# Patient Record
Sex: Female | Born: 1970 | Race: Black or African American | Hispanic: No | Marital: Married | Smoking: Never smoker
Health system: Southern US, Community
[De-identification: ages and names within clinical notes are randomized; demographics above are authoritative.]

## PROBLEM LIST (undated history)

## (undated) DIAGNOSIS — M1990 Unspecified osteoarthritis, unspecified site: Secondary | ICD-10-CM

## (undated) DIAGNOSIS — I10 Essential (primary) hypertension: Secondary | ICD-10-CM

## (undated) DIAGNOSIS — E785 Hyperlipidemia, unspecified: Secondary | ICD-10-CM

## (undated) DIAGNOSIS — D649 Anemia, unspecified: Secondary | ICD-10-CM

## (undated) DIAGNOSIS — G4733 Obstructive sleep apnea (adult) (pediatric): Secondary | ICD-10-CM

## (undated) DIAGNOSIS — G4730 Sleep apnea, unspecified: Secondary | ICD-10-CM

## (undated) DIAGNOSIS — R600 Localized edema: Secondary | ICD-10-CM

## (undated) DIAGNOSIS — M109 Gout, unspecified: Secondary | ICD-10-CM

## (undated) HISTORY — DX: Unspecified osteoarthritis, unspecified site: M19.90

## (undated) HISTORY — DX: Sleep apnea, unspecified: G47.30

## (undated) HISTORY — PX: TUBAL LIGATION: SHX77

## (undated) HISTORY — DX: Hyperlipidemia, unspecified: E78.5

## (undated) HISTORY — DX: Obstructive sleep apnea (adult) (pediatric): G47.33

## (undated) HISTORY — DX: Localized edema: R60.0

## (undated) HISTORY — DX: Gout, unspecified: M10.9

---

## 2001-02-10 ENCOUNTER — Inpatient Hospital Stay (HOSPITAL_COMMUNITY): Admission: AD | Admit: 2001-02-10 | Discharge: 2001-02-10 | Payer: Self-pay | Admitting: Obstetrics & Gynecology

## 2004-09-04 ENCOUNTER — Encounter: Admission: RE | Admit: 2004-09-04 | Discharge: 2004-12-03 | Payer: Self-pay

## 2010-07-08 DIAGNOSIS — L659 Nonscarring hair loss, unspecified: Secondary | ICD-10-CM | POA: Insufficient documentation

## 2012-10-03 ENCOUNTER — Other Ambulatory Visit (HOSPITAL_COMMUNITY): Payer: Self-pay | Admitting: Obstetrics and Gynecology

## 2012-10-03 DIAGNOSIS — Z1231 Encounter for screening mammogram for malignant neoplasm of breast: Secondary | ICD-10-CM

## 2012-10-11 ENCOUNTER — Ambulatory Visit (HOSPITAL_COMMUNITY)
Admission: RE | Admit: 2012-10-11 | Discharge: 2012-10-11 | Disposition: A | Discharge status: Home / Self Care | Payer: 59 | Source: Ambulatory Visit | Attending: Obstetrics and Gynecology | Admitting: Obstetrics and Gynecology

## 2012-10-11 DIAGNOSIS — Z1231 Encounter for screening mammogram for malignant neoplasm of breast: Secondary | ICD-10-CM | POA: Insufficient documentation

## 2014-08-28 ENCOUNTER — Inpatient Hospital Stay (HOSPITAL_COMMUNITY): Payer: 59

## 2014-08-28 ENCOUNTER — Emergency Department (HOSPITAL_COMMUNITY): Payer: 59

## 2014-08-28 ENCOUNTER — Encounter (HOSPITAL_COMMUNITY): Payer: Self-pay | Admitting: Emergency Medicine

## 2014-08-28 ENCOUNTER — Observation Stay (HOSPITAL_COMMUNITY)
Admission: EM | Admit: 2014-08-28 | Discharge: 2014-08-29 | Disposition: A | Discharge status: Home / Self Care | Payer: 59 | Attending: Internal Medicine | Admitting: Internal Medicine

## 2014-08-28 DIAGNOSIS — Z880 Allergy status to penicillin: Secondary | ICD-10-CM | POA: Diagnosis not present

## 2014-08-28 DIAGNOSIS — R55 Syncope and collapse: Secondary | ICD-10-CM | POA: Diagnosis present

## 2014-08-28 DIAGNOSIS — R200 Anesthesia of skin: Secondary | ICD-10-CM | POA: Diagnosis present

## 2014-08-28 DIAGNOSIS — D649 Anemia, unspecified: Secondary | ICD-10-CM | POA: Diagnosis not present

## 2014-08-28 DIAGNOSIS — M1990 Unspecified osteoarthritis, unspecified site: Secondary | ICD-10-CM | POA: Insufficient documentation

## 2014-08-28 DIAGNOSIS — R0781 Pleurodynia: Principal | ICD-10-CM | POA: Insufficient documentation

## 2014-08-28 DIAGNOSIS — N920 Excessive and frequent menstruation with regular cycle: Secondary | ICD-10-CM | POA: Insufficient documentation

## 2014-08-28 DIAGNOSIS — R531 Weakness: Secondary | ICD-10-CM

## 2014-08-28 DIAGNOSIS — I639 Cerebral infarction, unspecified: Secondary | ICD-10-CM | POA: Diagnosis present

## 2014-08-28 DIAGNOSIS — I10 Essential (primary) hypertension: Secondary | ICD-10-CM | POA: Diagnosis not present

## 2014-08-28 DIAGNOSIS — R0789 Other chest pain: Secondary | ICD-10-CM

## 2014-08-28 DIAGNOSIS — G459 Transient cerebral ischemic attack, unspecified: Secondary | ICD-10-CM

## 2014-08-28 DIAGNOSIS — R079 Chest pain, unspecified: Secondary | ICD-10-CM | POA: Diagnosis present

## 2014-08-28 DIAGNOSIS — D259 Leiomyoma of uterus, unspecified: Secondary | ICD-10-CM | POA: Insufficient documentation

## 2014-08-28 HISTORY — DX: Anemia, unspecified: D64.9

## 2014-08-28 HISTORY — DX: Essential (primary) hypertension: I10

## 2014-08-28 LAB — DIFFERENTIAL
Basophils Absolute: 0 10*3/uL (ref 0.0–0.1)
Basophils Relative: 0 % (ref 0–1)
EOS PCT: 3 % (ref 0–5)
Eosinophils Absolute: 0.3 10*3/uL (ref 0.0–0.7)
LYMPHS ABS: 3.1 10*3/uL (ref 0.7–4.0)
Lymphocytes Relative: 31 % (ref 12–46)
MONOS PCT: 6 % (ref 3–12)
Monocytes Absolute: 0.6 10*3/uL (ref 0.1–1.0)
NEUTROS ABS: 5.9 10*3/uL (ref 1.7–7.7)
Neutrophils Relative %: 60 % (ref 43–77)

## 2014-08-28 LAB — COMPREHENSIVE METABOLIC PANEL
ALK PHOS: 87 U/L (ref 39–117)
ALT: 14 U/L (ref 0–35)
AST: 16 U/L (ref 0–37)
Albumin: 3.7 g/dL (ref 3.5–5.2)
Anion gap: 4 — ABNORMAL LOW (ref 5–15)
BILIRUBIN TOTAL: 0.4 mg/dL (ref 0.3–1.2)
BUN: 9 mg/dL (ref 6–23)
CHLORIDE: 107 mmol/L (ref 96–112)
CO2: 27 mmol/L (ref 19–32)
Calcium: 9.1 mg/dL (ref 8.4–10.5)
Creatinine, Ser: 0.78 mg/dL (ref 0.50–1.10)
GFR calc Af Amer: >90 mL/min (ref >90)
GFR calc non Af Amer: >90 mL/min (ref >90)
Glucose, Bld: 105 mg/dL — ABNORMAL HIGH (ref 70–99)
POTASSIUM: 4.2 mmol/L (ref 3.5–5.1)
SODIUM: 138 mmol/L (ref 135–145)
TOTAL PROTEIN: 7.1 g/dL (ref 6.0–8.3)

## 2014-08-28 LAB — CBC
HCT: 38.9 % (ref 36.0–46.0)
Hemoglobin: 12 g/dL (ref 12.0–15.0)
MCH: 21.6 pg — ABNORMAL LOW (ref 26.0–34.0)
MCHC: 30.8 g/dL (ref 30.0–36.0)
MCV: 70 fL — AB (ref 78.0–100.0)
Platelets: 380 10*3/uL (ref 150–400)
RBC: 5.56 MIL/uL — ABNORMAL HIGH (ref 3.87–5.11)
RDW: 18.1 % — ABNORMAL HIGH (ref 11.5–15.5)
WBC: 9.9 10*3/uL (ref 4.0–10.5)

## 2014-08-28 LAB — I-STAT CHEM 8, ED
BUN: 9 mg/dL (ref 6–23)
CHLORIDE: 104 mmol/L (ref 96–112)
Calcium, Ion: 1.12 mmol/L (ref 1.12–1.23)
Creatinine, Ser: 0.7 mg/dL (ref 0.50–1.10)
Glucose, Bld: 105 mg/dL — ABNORMAL HIGH (ref 70–99)
HEMATOCRIT: 43 % (ref 36.0–46.0)
HEMOGLOBIN: 14.6 g/dL (ref 12.0–15.0)
POTASSIUM: 4.1 mmol/L (ref 3.5–5.1)
SODIUM: 140 mmol/L (ref 135–145)
TCO2: 23 mmol/L (ref 0–100)

## 2014-08-28 LAB — TROPONIN I

## 2014-08-28 LAB — URINALYSIS, ROUTINE W REFLEX MICROSCOPIC
Bilirubin Urine: NEGATIVE
Glucose, UA: NEGATIVE mg/dL
Hgb urine dipstick: NEGATIVE
Ketones, ur: NEGATIVE mg/dL
Leukocytes, UA: NEGATIVE
Nitrite: NEGATIVE
Protein, ur: NEGATIVE mg/dL
SPECIFIC GRAVITY, URINE: 1.011 (ref 1.005–1.030)
UROBILINOGEN UA: 0.2 mg/dL (ref 0.0–1.0)
pH: 7 (ref 5.0–8.0)

## 2014-08-28 LAB — ETHANOL: Alcohol, Ethyl (B): <5 mg/dL (ref 0–9)

## 2014-08-28 LAB — LIPASE, BLOOD: Lipase: 23 U/L (ref 11–59)

## 2014-08-28 LAB — PROTIME-INR
INR: 1.02 (ref 0.00–1.49)
Prothrombin Time: 13.5 seconds (ref 11.6–15.2)

## 2014-08-28 LAB — RAPID URINE DRUG SCREEN, HOSP PERFORMED
Amphetamines: NOT DETECTED
Barbiturates: NOT DETECTED
Benzodiazepines: NOT DETECTED
Cocaine: NOT DETECTED
Opiates: NOT DETECTED
Tetrahydrocannabinol: NOT DETECTED

## 2014-08-28 LAB — I-STAT TROPONIN, ED: Troponin i, poc: 0 ng/mL (ref 0.00–0.08)

## 2014-08-28 LAB — I-STAT BETA HCG BLOOD, ED (MC, WL, AP ONLY)

## 2014-08-28 LAB — APTT: aPTT: 28 seconds (ref 24–37)

## 2014-08-28 MED ORDER — ATORVASTATIN CALCIUM 10 MG PO TABS
20.0000 mg | ORAL_TABLET | Freq: Every day | ORAL | Status: DC
  Administered 2014-08-28: 20 mg via ORAL
  Filled 2014-08-28: qty 2

## 2014-08-28 MED ORDER — ASPIRIN 300 MG RE SUPP: 300.0000 mg | Freq: Every day | RECTAL | Status: DC

## 2014-08-28 MED ORDER — ONDANSETRON HCL 4 MG/2ML IJ SOLN
4.0000 mg | Freq: Once | INTRAMUSCULAR | Status: AC
  Administered 2014-08-28: 4 mg via INTRAVENOUS
  Filled 2014-08-28: qty 2

## 2014-08-28 MED ORDER — SODIUM CHLORIDE 0.9 % IV SOLN: INTRAVENOUS | Status: DC

## 2014-08-28 MED ORDER — ASPIRIN 325 MG PO TABS
325.0000 mg | ORAL_TABLET | Freq: Every day | ORAL | Status: DC
  Administered 2014-08-29: 325 mg via ORAL
  Filled 2014-08-28: qty 1

## 2014-08-28 MED ORDER — IOHEXOL 350 MG/ML SOLN
100.0000 mL | Freq: Once | INTRAVENOUS | Status: AC | PRN
  Administered 2014-08-28: 100 mL via INTRAVENOUS

## 2014-08-28 MED ORDER — SENNOSIDES-DOCUSATE SODIUM 8.6-50 MG PO TABS: 1.0000 | ORAL_TABLET | Freq: Every evening | ORAL | Status: DC | PRN

## 2014-08-28 MED ORDER — ENOXAPARIN SODIUM 40 MG/0.4ML ~~LOC~~ SOLN
40.0000 mg | SUBCUTANEOUS | Status: DC
  Administered 2014-08-28: 40 mg via SUBCUTANEOUS
  Filled 2014-08-28: qty 0.4

## 2014-08-28 MED ORDER — TRAMADOL-ACETAMINOPHEN 37.5-325 MG PO TABS
2.0000 | ORAL_TABLET | Freq: Four times a day (QID) | ORAL | Status: DC | PRN
  Administered 2014-08-28 – 2014-08-29 (×4): 2 via ORAL
  Filled 2014-08-28 (×4): qty 2

## 2014-08-28 MED ORDER — SODIUM CHLORIDE 0.9 % IV SOLN
INTRAVENOUS | Status: DC
  Administered 2014-08-28: 18:00:00 via INTRAVENOUS

## 2014-08-28 MED ORDER — STROKE: EARLY STAGES OF RECOVERY BOOK
Freq: Once | Status: AC
  Administered 2014-08-28: 18:00:00

## 2014-08-28 NOTE — ED Notes (Signed)
Pt stating she is having sharp intermittent mid cp.

## 2014-08-28 NOTE — ED Provider Notes (Signed)
CSN: [id number]     Arrival date & time 08/28/14  1218 History   First MD Initiated Contact with Patient 08/28/14 1313     Chief Complaint  Patient presents with  . Code Stroke    @EDPCLEARED @ (Consider location/radiation/quality/duration/timing/severity/associated sxs/prior Treatment) HPI The patient reports to me she is having chest pain which is fairly sharp and central in nature. She describes having had right arm symptoms of some pain and numbness that has been coming and going. Also she is endorsing pain in her right leg that preceded onset of today's symptoms. Upon arrival the patient was evaluated for a code stroke as her chief complaint had been for right arm numbness and per EMS observation there was right arm drift present. The main event at work today occurred at 11:15. Her right arm was more numb and painful and she started having a coughing episode. The patient reports that she got very lightheaded and she nearly passed out at work. The patient has a past medical history significant for hypertension but has been off blood pressure medications for years duration. Past Medical History  Diagnosis Date  . Hypertension   . Anemia    Past Surgical History  Procedure Laterality Date  . Cesarean section     No family history on file. History  Substance Use Topics  . Smoking status: Never Smoker   . Smokeless tobacco: Not on file  . Alcohol Use: Yes     Comment: occasional glass of wine   OB History    No data available     Review of Systems 10 Systems reviewed and are negative for acute change except as noted in the HPI.    Allergies  Penicillins  Home Medications   Prior to Admission medications   Medication Sig Start Date End Date Taking? Authorizing Provider  naproxen sodium (ANAPROX) 220 MG tablet Take 440 mg by mouth 2 (two) times daily with a meal.   Yes Historical Provider, MD   BP 137/32 mmHg  Pulse 88  Temp(Src) 98.2 F (36.8 C) (Oral)  Resp 15  Ht  5\' 4"  (1.626 m)  Wt 397 lb 7.8 oz (180.3 kg)  BMI 68.20 kg/m2  SpO2 98%  LMP  Physical Exam  Constitutional: She is oriented to person, place, and time.  The patient is morbidly obese. She is alert and nontoxic. She does not have any acute respiratory distress. Her color is good  HENT:  Head: Normocephalic and atraumatic.  Right Ear: External ear normal.  Left Ear: External ear normal.  Nose: Nose normal.  Mouth/Throat: Oropharynx is clear and moist. No oropharyngeal exudate.  Eyes: EOM are normal. Pupils are equal, round, and reactive to light. Right eye exhibits no discharge. Left eye exhibits no discharge.  Neck: Neck supple.  Cardiovascular: Normal rate, regular rhythm, normal heart sounds and intact distal pulses.   No murmur heard. Pulmonary/Chest: Effort normal and breath sounds normal. No respiratory distress. She has no wheezes.  Abdominal: Soft. Bowel sounds are normal. She exhibits no distension. There is no tenderness. There is no guarding.  Musculoskeletal: Normal range of motion. She exhibits tenderness (The patient endorses tenderness to palpation of the soft tissues generally of the right upper extremity. There is no appreciable erythema or soft tissue abnormality. The distal pulses are 2+ and symmetric. ). She exhibits no edema.  The patient endorses tenderness palpation of the right lower leg. There is no apparent deformity or asymmetry. She is tender in the calf.  Neurological:  She is alert and oriented to person, place, and time. No cranial nerve deficit. She exhibits normal muscle tone. Coordination normal.  Skin: Skin is warm and dry. No rash noted.  Psychiatric: She has a normal mood and affect.    ED Course  Procedures (including critical care time) Labs Review Labs Reviewed  CBC - Abnormal; Notable for the following:    RBC 5.56 (*)    MCV 70.0 (*)    MCH 21.6 (*)    RDW 18.1 (*)    All other components within normal limits  COMPREHENSIVE METABOLIC PANEL  - Abnormal; Notable for the following:    Glucose, Bld 105 (*)    Anion gap 4 (*)    All other components within normal limits  I-STAT CHEM 8, ED - Abnormal; Notable for the following:    Glucose, Bld 105 (*)    All other components within normal limits  ETHANOL  PROTIME-INR  APTT  DIFFERENTIAL  URINE RAPID DRUG SCREEN (HOSP PERFORMED)  URINALYSIS, ROUTINE W REFLEX MICROSCOPIC  LIPASE, BLOOD  I-STAT TROPOININ, ED  I-STAT TROPOININ, ED  I-STAT BETA HCG BLOOD, ED ([facility], [facility], [facility] ONLY)    Imaging Review Ct Head Wo Contrast  08/28/2014   CLINICAL DATA:  Code stroke.  Right arm and leg pain.  EXAM: CT HEAD WITHOUT CONTRAST  TECHNIQUE: Contiguous axial images were obtained from the base of the skull through the vertex without intravenous contrast.  COMPARISON:  None.  FINDINGS: There is no evidence of acute intracranial hemorrhage, mass lesion, brain edema or extra-axial fluid collection. The ventricles and subarachnoid spaces are appropriately sized for age. There is no CT evidence of acute cortical infarction.  There is mucosal thickening or a mucous retention cyst laterally in the left frontal sinus. The visualized paranasal sinuses, mastoid air cells and middle ears are otherwise clear. The calvarium is intact.  IMPRESSION: 1. No acute intracranial findings.  No CT evidence of acute stroke. 2. Left frontal sinus mucosal thickening versus mucous retention cyst. 3. These results were called by telephone at the time of interpretation on 08/28/2014 at 12:38 pm to Dr. Nicole Kindred, who verbally acknowledged these results.   Electronically Signed   By: Richardean Sale M.D.   On: 08/28/2014 12:46     EKG Interpretation None      MDM   Final diagnoses:  Right sided weakness  Chest pain, unspecified chest pain type  Syncope and collapse   The patient presents on above this point time she will be admitted for further diagnostic evaluation and observation. Her mental status is clear and she has no  respiratory distress or airway difficulty. At the time of my examination there was not any significant localizing weakness. There was a significant pain component as well as the complaint of numbness.    Charlesetta Shanks, MD 08/28/14 1600

## 2014-08-28 NOTE — Progress Notes (Signed)
Patient arrived at 1N13. VSS, assessment completed and charted. Patient experiences periods of trouble breathing, lasting about 5 seconds. Patient told to take deep breaths, SaO2 100% RA, RR 16. MD paged no new orders.

## 2014-08-28 NOTE — ED Notes (Signed)
Admitting MD at bedside.

## 2014-08-28 NOTE — Code Documentation (Signed)
44yo female arriving to [facility] via [place] at 1210.  EMS reports that the patient was at work when she had a coughing spell and then syncopal episode and fell from her chair.  Coworkers report no LOC, however, patient does not recall episode.  Patient vomited after episode and then c/o right arm and leg pain and sharp chest pain.  EMS assessed right arm drift and numbness and tingling to the right face and arm and activated Code Stroke.  Stroke team at the bedside on arrival.  Labs drawn and patient to CT.  NIHSS 4, see documentation for details and code stroke times. Patient with right arm and leg pain with movement.  Patient reports right face and arm decreased sensation.  Patient is too mild to treat at this time but remains in the window to treat with tPA until 1545.  Bedside handoff with ED RN Hayley.

## 2014-08-28 NOTE — ED Notes (Signed)
Pt arrived by [facility] as Code Stroke. Pt was at work and started having right arm numbness and pain and right leg pain at 1115. Pt also started coughing and had a near syncopal episode per coworkers and then vomited. Pt stated that she feels like she passed out. Pt also having some CP as well. Right arm numbness and pain has been going off and on x 2 weeks but currently not going away. EMS noted right arm drift and right arm decrease in sensation. BP-167/108 HR-76 CBG-180. Pt has not been on BP medications x 1 year.

## 2014-08-28 NOTE — Consult Note (Signed)
Referring Physician: EDP    Chief Complaint: Code stroke  HPI:                                                                                                                                         Angela Tucker is an 44 y.o. female obesity, menorrhagia, right mild osteoarthritis, uterine fibroids, multigravida, HTN (not taking BP medications for 1 year per patient) presented to ED with code stroke.  Prior to ED visit she had intermittent entire right arm numbness noted 2 days ago which was transient.  Right arm numbness stopped and returned today while at work.  At work she initially had a coughing spell,  then a syncopal episode and fell from her chair but without LOC and though patient does not recall the episode.  She reported right upper extremity numbness and pain which started between 11 and 11:15 and lasting longer this occurrence.    Date last known well: Date: 08/28/2014 Time last known well: Time: 11:00 prior to 11 am.  tPA Given: No: minimal deficits risks outweigh benefits  Past Medical History  Diagnosis Date  . Hypertension   . Anemia     Past Surgical History  Procedure Laterality Date  . Cesarean section     Family history: Positive for stroke paternal and maternal both grandparents  Social History:  reports that she has never smoked. She does not have any smokeless tobacco history on file. She reports that she drinks alcohol. She reports that she does not use illicit drugs.  Allergies:  Allergies  Allergen Reactions  . Penicillins Hives    Medications:                                                                                                                           I have reviewed the patient's current medications.  ROS:  History obtained from the patient, husband  General ROS: negative for - fever Psychological ROS:  negative for - memory difficulties Ophthalmic ROS: negative for - blurry vision, double vision, eye pain or loss of vision ENT ROS: negative for - decreased hearing Hematological and Lymphatic ROS: negative for - bleeding problems, bruising or swollen lymph nodes Endocrine ROS: negative for - diabetes  Respiratory ROS: negative for - shortness of breath  Cardiovascular ROS: negative for - chest pain Gastrointestinal ROS: negative for - abdominal pain Musculoskeletal ROS: negative for - +joint swelling and pain right knee  Neurological ROS: as noted in HPI Dermatological ROS: negative for rash and skin lesion changes  Neurologic Examination:                                                                                                      Blood pressure 141/64, pulse 82, temperature 98.2 F (36.8 C), temperature source Oral, resp. rate 12, height 5\' 4"  (1.626 m), weight 397 lb 7.8 oz (180.3 kg), SpO2 99 %.  HEENT-  Normocephalic, no lesions, without obvious abnormality.  Normal external eye and conjunctiva.  Normal TM's bilaterally.  Normal external ears. Normal external nose, mucus membranes and septum.  Normal pharynx. Cardiovascular- regular rate and rhythm, no murmurs pulses palpable throughout   Lungs- chest clear, no wheezing, rales, normal symmetric air entry,  Abdomen- soft, non-tender; bowel sounds normal; no masses,  no organomegaly, obese Extremities- no edema, no cyanosis  Lymph-no gross adenopathy  Musculoskeletal-+right knee pain anterior and posterior, decreased ROM right knee, +right shoulder pain with ROM Skin-warm and dry, no hyperpigmentation, vitiligo, or suspicious lesions  Neurological Examination Mental Status: Alert, oriented, thought content appropriate.  Speech fluent without evidence of aphasia.  Able to follow 3 step commands without difficulty. Cranial Nerves: II: Discs flat bilaterally; Visual fields grossly normal, pupils equal, round, reactive to light  and accommodation III,IV, VI: ptosis not present, extra-ocular motions intact bilaterally V,VII: smile symmetric, facial light touch sensation normal bilaterally VIII: hearing normal bilaterally IX,X: gag reflex present XI: bilateral shoulder shrug XII: midline tongue extension Motor: Right : Upper extremity   5/5    Left:     Upper extremity   5/5  Lower extremity   5/5     Lower extremity   5/5 Tone and bulk:normal tone throughout; no atrophy noted Sensory: decreased sensation to light touch right arm and right V1 otherwise wnl Deep Tendon Reflexes: 2+ and symmetric throughout Plantars: Right: downgoing   Left: downgoing Cerebellar: normal finger-to-nose, did assess heel-to-shin test due to body habitus and pain in lower extremities right>left, +pronator drift in right arm 2/2 pain Gait: not assessed        Lab Results: Basic Metabolic Panel:  Recent Labs Lab 08/28/14 1222  NA 140  K 4.1  CL 104  GLUCOSE 105*  BUN 9  CREATININE 0.70    CBC:  Recent Labs Lab 08/28/14 1214 08/28/14 1222  WBC 9.9  --   NEUTROABS PENDING  --   HGB 12.0 14.6  HCT 38.9 43.0  MCV 70.0*  --  PLT 380  --      Imaging: Ct Head Wo Contrast  08/28/2014   CLINICAL DATA:  Code stroke.  Right arm and leg pain.  EXAM: CT HEAD WITHOUT CONTRAST  TECHNIQUE: Contiguous axial images were obtained from the base of the skull through the vertex without intravenous contrast.  COMPARISON:  None.  FINDINGS: There is no evidence of acute intracranial hemorrhage, mass lesion, brain edema or extra-axial fluid collection. The ventricles and subarachnoid spaces are appropriately sized for age. There is no CT evidence of acute cortical infarction.  There is mucosal thickening or a mucous retention cyst laterally in the left frontal sinus. The visualized paranasal sinuses, mastoid air cells and middle ears are otherwise clear. The calvarium is intact.  IMPRESSION: 1. No acute intracranial findings.  No CT  evidence of acute stroke. 2. Left frontal sinus mucosal thickening versus mucous retention cyst. 3. These results were called by telephone at the time of interpretation on 08/28/2014 at 12:38 pm to Dr. Nicole Kindred, who verbally acknowledged these results.   Electronically Signed   By: Richardean Sale M.D.   On: 08/28/2014 12:46       Assessment: 44 y.o. female obesity, menorrhagia, right mild osteoarthritis, uterine fibroids, multigravida, HTN (not taking BP medications for 1 year per patient) presented with code stroke with right V1, right upper extremity numbness, syncope and chest pain.     #TIA vs stroke  -ABCD2 score at least 1.  CT head negative.  Recommend primary order TIA/Stroke work up with MRI/A, US carotids, echo with bubble to r/o PFO, lipid, HA1C, UDS, tsh, EKG, monitor via tele, coags order hypercoagulable panel  -rec order Aspirin 325 mg -admit to medicine -hold TPA for now (see above) -control risk factors, monitor blood pressure -Neuro will continue to follow appreciate consult   #Syncope, Chest pain r/o ACS -per primary -rec trend troponins, orthostatics, and monitor via tele   Stroke Risk Factors - hypertension, patient denies h/o smoking, FH stroke paternal and maternal both grandparents   Assessment and plan discussed with with attending physician and they are in agreement.    Karlyn Agee MD with Etta Quill PA-C [facility] [phone]  08/28/2014, 12:57 PM  I personally participated in this patient's evaluation and management, including formulating the above clinical assessment and management recommendations.  Rush Farmer M.D. [facility] [phone]

## 2014-08-28 NOTE — H&P (Addendum)
[facility] History and Physical  Angela Tucker YFV:[id number] DOB: June 01, 1971 DOA: 08/28/2014  Referring physician: Dr. Vallery Ridge PCP: No primary care provider on file.   Chief Complaint:  Right-upper extremity weakness with numbness syncope    HPI:  44 year old morbidly obese female with history of hypertension not on any medications for the past 1 year presented to the ED after having an acute onset right-extremity weakness and numbness and syncope at work. Patient reports that when she came to work this morning she developed numbness in her hand and then involving her entire right arm and was unable to move. She then had cough with 2 episodes of vomiting following which she had a brief syncope lasting for few seconds. When she woke up her right arm still felt numb and weak. She then started having pain in her right arm and substernal area.she reports having occasional chest pain and right hand numbness but had similar symptoms before. Patient denies headache, dizziness, fever, chills, nausea , vomiting, palpitations, SOB, abdominal pain, bowel or urinary symptoms. Denies any sick contacts or recent travel.Denies change in weight or appetite. She was previously on antihypertensives until one year back but had stopped taking them as her GYN told that her blood pressures were normal.  Course in the ED Code stroke was called and neuro hospitalist consulted. A head CT was done which was negative for acute infarct . Patient was not given tPA given minimal deficits with a risk outweighing benefits. Vitals in the ED were normal. Blood will done was unremarkable. A CT angiogram of the chest was done given syncope to rule out PE and was negative. EKG was unremarkable. Neuro hospitalist recommended CVA workup and admit to hospitalist service .  Review of Systems:  Constitutional: Denies fever, chills, diaphoresis, appetite change and fatigue.  HEENT: Denies photophobia, eye pain, redness,  hearing loss, ear pain, congestion, sore throat,  trouble swallowing, neck pain, neck stiffness  Respiratory: chest tightness, Denies SOB, DOE, cough,  and wheezing.   Cardiovascular: chest pain, denies palpitations and leg swelling.  Gastrointestinal:  nausea, vomiting, denies abdominal pain, diarrhea, constipation, blood in stool and abdominal distention.  Genitourinary: Denies dysuria,  hematuria, flank pain and difficulty urinating.  Endocrine: Denies: hot or cold intolerance, polyuria, polydipsia. Musculoskeletal: pain in right arm and leg. Denies myalgias, back pain, joint pain Skin: Denies , rash and wound.  Neurological: syncope, weakness Denies dizziness, seizures,  light-headedness, numbness and headaches.  Hematological: Denies adenopathy.  Psychiatric/Behavioral: Denies confusion.    Past Medical History  Diagnosis Date  . Hypertension   . Anemia    Past Surgical History  Procedure Laterality Date  . Cesarean section     Social History:  reports that she has never smoked. She does not have any smokeless tobacco history on file. She reports that she drinks alcohol. She reports that she does not use illicit drugs.  Allergies  Allergen Reactions  . Penicillins Hives    Family history: history of stroke in her grandparents   Prior to Admission medications   Medication Sig Start Date End Date Taking? Authorizing Provider  naproxen sodium (ANAPROX) 220 MG tablet Take 440 mg by mouth 2 (two) times daily with a meal.   Yes Historical Provider, MD     Physical Exam:  Filed Vitals:   08/28/14 1500 08/28/14 1515 08/28/14 1529 08/28/14 1530  BP: 135/81 120/60 127/50 137/32  Pulse: 83 78 86 88  Temp:      TempSrc:  Resp: 16 16 21 15   Height:      Weight:      SpO2: 100% 99% 97% 98%    Constitutional: Vital signs reviewed.  Morbidly obese female lying in bed in no acute distress HEENT: no pallor, no icterus, moist oral mucosa, no cervical lymphadenopathy,neck  supple Cardiovascular: RRR, S1 normal, S2 normal, no MRG Chest: CTAB, no wheezes, rales, or rhonchi Abdominal: obese, soft. Non-tender, non-distended, bowel sounds are normal, n Ext: warm, no edema Neurological: A&O x3,cranial nerves II-12 intact, limited strength in the right arm and hand due to pain, plantars downgoing bilaterally, normal strength and sensation in all other extremities. Cerebellar function intact.  Labs on Admission:  Basic Metabolic Panel:  Recent Labs Lab 08/28/14 1214 08/28/14 1222  NA 138 140  K 4.2 4.1  CL 107 104  CO2 27  --   GLUCOSE 105* 105*  BUN 9 9  CREATININE 0.78 0.70  CALCIUM 9.1  --    Liver Function Tests:  Recent Labs Lab 08/28/14 1214  AST 16  ALT 14  ALKPHOS 87  BILITOT 0.4  PROT 7.1  ALBUMIN 3.7   No results for input(s): LIPASE, AMYLASE in the last 168 hours. No results for input(s): AMMONIA in the last 168 hours. CBC:  Recent Labs Lab 08/28/14 1214 08/28/14 1222  WBC 9.9  --   NEUTROABS 5.9  --   HGB 12.0 14.6  HCT 38.9 43.0  MCV 70.0*  --   PLT 380  --    Cardiac Enzymes: No results for input(s): CKTOTAL, CKMB, CKMBINDEX, TROPONINI in the last 168 hours. BNP: Invalid input(s): POCBNP CBG: No results for input(s): GLUCAP in the last 168 hours.  Radiological Exams on Admission: Ct Head Wo Contrast  08/28/2014   CLINICAL DATA:  Code stroke.  Right arm and leg pain.  EXAM: CT HEAD WITHOUT CONTRAST  TECHNIQUE: Contiguous axial images were obtained from the base of the skull through the vertex without intravenous contrast.  COMPARISON:  None.  FINDINGS: There is no evidence of acute intracranial hemorrhage, mass lesion, brain edema or extra-axial fluid collection. The ventricles and subarachnoid spaces are appropriately sized for age. There is no CT evidence of acute cortical infarction.  There is mucosal thickening or a mucous retention cyst laterally in the left frontal sinus. The visualized paranasal sinuses, mastoid  air cells and middle ears are otherwise clear. The calvarium is intact.  IMPRESSION: 1. No acute intracranial findings.  No CT evidence of acute stroke. 2. Left frontal sinus mucosal thickening versus mucous retention cyst. 3. These results were called by telephone at the time of interpretation on 08/28/2014 at 12:38 pm to Dr. Nicole Kindred, who verbally acknowledged these results.   Electronically Signed   By: Richardean Sale M.D.   On: 08/28/2014 12:46   Ct Angio Chest Pe W/cm &/or Wo Cm  08/28/2014   CLINICAL DATA:  44 year old female with chest pain, shortness of breath, right side extremity pain. Initial encounter.  EXAM: CT ANGIOGRAPHY CHEST WITH CONTRAST  TECHNIQUE: Multidetector CT imaging of the chest was performed using the standard protocol during bolus administration of intravenous contrast. Multiplanar CT image reconstructions and MIPs were obtained to evaluate the vascular anatomy.  CONTRAST:  143mL OMNIPAQUE IOHEXOL 350 MG/ML SOLN  COMPARISON:  None.  FINDINGS: Suboptimal contrast bolus timing in the pulmonary arterial tree. Respiratory motion artifact in the lower lobes. No focal filling defect identified in the pulmonary arterial tree to suggest the presence of acute pulmonary embolism.  Major airways are patent. Mild dependent pulmonary atelectasis. No pericardial or pleural effusion. Negative visualized aorta. No mediastinal lymphadenopathy. Negative thoracic inlet. Axillary lymph nodes within normal limits.  Negative visualized liver, gallbladder, spleen, pancreas, adrenal glands and left kidney in the upper abdomen. Visible stomach is distended with fluid. There is hyperdense material in the right upper pole renal collecting system, it might be IV contrast from a test bolus.  Chronic facet degeneration in the lower thoracic spine. No acute osseous abnormality identified.  Review of the MIP images confirms the above findings.  IMPRESSION: No evidence of acute pulmonary embolus. No acute findings  identified in the chest.   Electronically Signed   By: Genevie Ann M.D.   On: 08/28/2014 16:09    EKG: normal sinus rhythm, no ST-T changes  Assessment/Plan  Principal Problem:   CVA (cerebral vascular accident) vs TIA  Active Problems:   Chest pain   Syncope   Essential hypertension   Morbid obesity    Acute CVA vs TIA Risk factors include morbid obesity and hypertension not taking any medications. Admit to telemetry  neuro checks q4 hr Head CT on admission negative for acute event. Ordered MRI brain, MRA head. -check 2D echo with bubble study givendisorder of syncope, carotid doppler, A1C and lipid panel -bedsideswallow eval, PT, OT. -ASA 325 mg daily. order Lipitor 20 mg daily.. Allow permissive HTN -Appreciate neurology recommendations.   Syncope Vasovagal following coughing and vomiting versus related to CVA. Monitor on telemetry. CT angiogram of the chest negative for PE. cycle serial troponins.   Chest pain Appears to be atypical given symptoms worsened with coughing and deep breath. Cycle serial cardiac enzymes. Follow 2-D echo  Morbid obesity Counseled on weight loss and exercise.  Right arm pain and numbness Patient complains of diffuse pain in her arm. PT eval in a.m.   Diet:NPO until  cleared by bedside swallow   DVT prophylaxis: sq lovenox   Code Status: full code Family Communication: None at bedside Disposition Plan: admit to telemetry  Murlin Schrieber, [facility] Pager [phone]  Total time spent on admission :55 minutes  If 7PM-7AM, please contact night-coverage [email or web address] Password [facility] 08/28/2014, 4:12 PM

## 2014-08-28 NOTE — ED Notes (Signed)
Pt transported to CT ?

## 2014-08-28 NOTE — ED Notes (Signed)
Attempted to call report. Floor RN unable to accept report.  

## 2014-08-29 ENCOUNTER — Other Ambulatory Visit: Payer: Self-pay | Admitting: Neurology

## 2014-08-29 DIAGNOSIS — G5601 Carpal tunnel syndrome, right upper limb: Secondary | ICD-10-CM

## 2014-08-29 DIAGNOSIS — I6789 Other cerebrovascular disease: Secondary | ICD-10-CM

## 2014-08-29 DIAGNOSIS — R200 Anesthesia of skin: Secondary | ICD-10-CM

## 2014-08-29 DIAGNOSIS — I10 Essential (primary) hypertension: Secondary | ICD-10-CM

## 2014-08-29 DIAGNOSIS — G5600 Carpal tunnel syndrome, unspecified upper limb: Secondary | ICD-10-CM | POA: Insufficient documentation

## 2014-08-29 LAB — LIPID PANEL
Cholesterol: 148 mg/dL (ref 0–200)
HDL: 32 mg/dL — AB (ref >39)
LDL CALC: 101 mg/dL — AB (ref 0–99)
Total CHOL/HDL Ratio: 4.6 RATIO
Triglycerides: 76 mg/dL (ref <150)
VLDL: 15 mg/dL (ref 0–40)

## 2014-08-29 LAB — TROPONIN I

## 2014-08-29 MED ORDER — PANTOPRAZOLE SODIUM 40 MG PO TBEC
40.0000 mg | DELAYED_RELEASE_TABLET | Freq: Every day | ORAL | Status: DC
  Administered 2014-08-29: 40 mg via ORAL
  Filled 2014-08-29: qty 1

## 2014-08-29 MED ORDER — GUAIFENESIN ER 600 MG PO TB12: 600.0000 mg | ORAL_TABLET | Freq: Two times a day (BID) | ORAL | Status: DC

## 2014-08-29 MED ORDER — ENOXAPARIN SODIUM 100 MG/ML ~~LOC~~ SOLN: 90.0000 mg | SUBCUTANEOUS | Status: DC

## 2014-08-29 MED ORDER — ENOXAPARIN SODIUM 80 MG/0.8ML ~~LOC~~ SOLN: 70.0000 mg | SUBCUTANEOUS | Status: DC

## 2014-08-29 MED ORDER — GUAIFENESIN ER 600 MG PO TB12
600.0000 mg | ORAL_TABLET | Freq: Two times a day (BID) | ORAL | Status: DC
  Administered 2014-08-29: 600 mg via ORAL
  Filled 2014-08-29: qty 1

## 2014-08-29 MED ORDER — PANTOPRAZOLE SODIUM 40 MG PO TBEC: 40.0000 mg | DELAYED_RELEASE_TABLET | Freq: Two times a day (BID) | ORAL | Status: DC

## 2014-08-29 MED ORDER — ATORVASTATIN CALCIUM 20 MG PO TABS: 20.0000 mg | ORAL_TABLET | Freq: Every day | ORAL | Status: DC

## 2014-08-29 MED ORDER — TRAMADOL-ACETAMINOPHEN 37.5-325 MG PO TABS: 1.0000 | ORAL_TABLET | Freq: Four times a day (QID) | ORAL | Status: DC | PRN

## 2014-08-29 NOTE — Care Management Note (Signed)
    Page 1 of 1   08/29/2014     3:24:27 PM CARE MANAGEMENT NOTE 08/29/2014  Patient:  Angela Tucker, Angela Tucker   Account Number:  [id number]  Date Initiated:  08/29/2014  Documentation initiated by:  Lorne Skeens  Subjective/Objective Assessment:   patient was admitted with numbness.     Action/Plan:   Will follow for discharge needs pending PT/OT evals and physician orders.   Anticipated DC Date:  08/29/2014   Anticipated DC Plan:  [place]  CM consult  PCP issues      Choice offered to / List presented to:     DME arranged  Vassie Moselle      DME agency  [place]        Status of service:  Completed, signed off Medicare Important Message given?   (If response is "NO", the following Medicare IM given date fields will be blank) Date Medicare IM given:   Medicare IM given by:   Date Additional Medicare IM given:   Additional Medicare IM given by:    Discharge Disposition:    Per UR Regulation:  Reviewed for med. necessity/level of care/duration of stay  If discussed at [place] of Stay Meetings, dates discussed:    Comments:  08/29/14 [place], MSN, CM- Patient was given the number for [facility] to establish a PCP. She was encouraged to begin this process today to obtain an appointment for hospital follow-up.  Rolling walker was requested from [facility] DME for probable discharge home today.

## 2014-08-29 NOTE — Progress Notes (Signed)
UR complete.  Meygan Kyser RN, MSN 

## 2014-08-29 NOTE — Progress Notes (Signed)
PT Cancellation Note  Patient Details Name: Angela Tucker MRN: [id number] DOB: 01/30/1971   Cancelled Treatment:    Reason Eval/Treat Not Completed: Patient at procedure or test/unavailable. Pt attempted x 2. Pt was undergoing cardiogram with bubble study at bedside and the upon PT return pt off floor at procedure. PT to return as able.   Kingsley Callander 08/29/2014, 10:44 AM  Kittie Plater, PT, DPT Pager #: [phone] Office #: [phone]

## 2014-08-29 NOTE — Discharge Summary (Signed)
Physician Discharge Summary  Angela Tucker HQI:[id number] DOB: [birth year] DOA: 08/28/2014  PCP: No primary care provider on file.  Admit date: 08/28/2014 Discharge date: 08/29/2014  Time spent: 35 minutes  Recommendations for Outpatient Follow-up:  1. Hb A c pending.  2. Follow up on BP, might need medications.  3. Further evaluation for diastolic dysfunction.  4. Follow up with neurology for Right Hand numbness, EMG   Discharge Diagnoses:    Syncope   Possible carpal tunnel syndrome.   Essential hypertension   Chest pain, pleuritic   Morbid obesity   Numbness   Discharge Condition: Stable.   Diet recommendation: Heart Healthy  Filed Weights   08/28/14 1233  Weight: 180.3 kg (397 lb 7.8 oz)    History of present illness:  45 year old morbidly obese female with history of hypertension not on any medications for the past 1 year presented to the ED after having an acute onset right-extremity weakness and numbness and syncope at work. Patient reports that when she came to work this morning she developed numbness in her hand and then involving her entire right arm and was unable to move. She then had cough with 2 episodes of vomiting following which she had a brief syncope lasting for few seconds. When she woke up her right arm still felt numb and weak. She then started having pain in her right arm and substernal area.she reports having occasional chest pain and right hand numbness but had similar symptoms before. Patient denies headache, dizziness, fever, chills, nausea , vomiting, palpitations, SOB, abdominal pain, bowel or urinary symptoms. Denies any sick contacts or recent travel.Denies change in weight or appetite. She was previously on antihypertensives until one year back but had stopped taking them as her GYN told that her blood pressures were normal.  Hospital Course:  1-Syncope; suspect vasovagal. ECHO with normal Ef. Carotid doppler with no significant stenosis.  Orthostatic vital negative. CT angio negative for PE. EKG sinus rhythm. Suspect vaso-vagal. MRI negative for stroke.   2-Right Arm Pain; neurology suspect carpal tunnel syndrome. Need out patient nerve conduction study. If persist might need MRI v=cervical spine.   3-Chest pain; pleuritic. Worse with cough, inspiration. Guaifenesin for cough, incentive spirometry. ECHO with normal EF. CT angio negative for PE. Will also give prescription for protonix. Denies chest pain or dyspnea on exertion.   4-Right leg pain; was diagnose with baker cyst. Pain management. Walker for gait balance.   patient to be out of work until 2-22, when balance improved and pain controlled.   Procedures:  ECHO; normal EF, diastolic dysfunction   Consultations:  none  Discharge Exam: Filed Vitals:   08/29/14 1529  BP: 141/83  Pulse: 73  Temp:   Resp:     General: Alert in no distress.  Cardiovascular: S 1, S 2 RRR Respiratory: CTA  Discharge Instructions   Discharge Instructions    Diet - low sodium heart healthy    Complete by:  As directed      Increase activity slowly    Complete by:  As directed           Current Discharge Medication List    START taking these medications   Details  atorvastatin (LIPITOR) 20 MG tablet Take 1 tablet (20 mg total) by mouth daily at 6 PM. Qty: 30 tablet, Refills: 0    guaiFENesin (MUCINEX) 600 MG 12 hr tablet Take 1 tablet (600 mg total) by mouth 2 (two) times daily. Qty: 30 tablet, Refills: 0  pantoprazole (PROTONIX) 40 MG tablet Take 1 tablet (40 mg total) by mouth 2 (two) times daily. Qty: 30 tablet, Refills: 0    traMADol-acetaminophen (ULTRACET) 37.5-325 MG per tablet Take 1 tablet by mouth every 6 (six) hours as needed for moderate pain. Qty: 30 tablet, Refills: 0      CONTINUE these medications which have NOT CHANGED   Details  naproxen sodium (ANAPROX) 220 MG tablet Take 440 mg by mouth 2 (two) times daily with a meal.       Allergies   Allergen Reactions  . Penicillins Hives   Follow-up Information    Follow up with SETHI,PRAMOD, MD In 1 week.   Specialties:  Neurology, Radiology   Contact information:   [address] [place] [address] [phone]        The results of significant diagnostics from this hospitalization (including imaging, microbiology, ancillary and laboratory) are listed below for reference.    Significant Diagnostic Studies: Ct Head Wo Contrast  08/28/2014   CLINICAL DATA:  Code stroke.  Right arm and leg pain.  EXAM: CT HEAD WITHOUT CONTRAST  TECHNIQUE: Contiguous axial images were obtained from the base of the skull through the vertex without intravenous contrast.  COMPARISON:  None.  FINDINGS: There is no evidence of acute intracranial hemorrhage, mass lesion, brain edema or extra-axial fluid collection. The ventricles and subarachnoid spaces are appropriately sized for age. There is no CT evidence of acute cortical infarction.  There is mucosal thickening or a mucous retention cyst laterally in the left frontal sinus. The visualized paranasal sinuses, mastoid air cells and middle ears are otherwise clear. The calvarium is intact.  IMPRESSION: 1. No acute intracranial findings.  No CT evidence of acute stroke. 2. Left frontal sinus mucosal thickening versus mucous retention cyst. 3. These results were called by telephone at the time of interpretation on 08/28/2014 at 12:38 pm to Dr. Nicole Kindred, who verbally acknowledged these results.   Electronically Signed   By: Richardean Sale M.D.   On: 08/28/2014 12:46   Ct Angio Chest Pe W/cm &/or Wo Cm  08/28/2014   CLINICAL DATA:  44 year old female with chest pain, shortness of breath, right side extremity pain. Initial encounter.  EXAM: CT ANGIOGRAPHY CHEST WITH CONTRAST  TECHNIQUE: Multidetector CT imaging of the chest was performed using the standard protocol during bolus administration of intravenous contrast. Multiplanar CT image  reconstructions and MIPs were obtained to evaluate the vascular anatomy.  CONTRAST:  159mL OMNIPAQUE IOHEXOL 350 MG/ML SOLN  COMPARISON:  None.  FINDINGS: Suboptimal contrast bolus timing in the pulmonary arterial tree. Respiratory motion artifact in the lower lobes. No focal filling defect identified in the pulmonary arterial tree to suggest the presence of acute pulmonary embolism.  Major airways are patent. Mild dependent pulmonary atelectasis. No pericardial or pleural effusion. Negative visualized aorta. No mediastinal lymphadenopathy. Negative thoracic inlet. Axillary lymph nodes within normal limits.  Negative visualized liver, gallbladder, spleen, pancreas, adrenal glands and left kidney in the upper abdomen. Visible stomach is distended with fluid. There is hyperdense material in the right upper pole renal collecting system, it might be IV contrast from a test bolus.  Chronic facet degeneration in the lower thoracic spine. No acute osseous abnormality identified.  Review of the MIP images confirms the above findings.  IMPRESSION: No evidence of acute pulmonary embolus. No acute findings identified in the chest.   Electronically Signed   By: Genevie Ann M.D.   On: 08/28/2014 16:09  Mr Brain Wo Contrast  08/28/2014   CLINICAL DATA:  Acute onset of RIGHT upper extremity weakness and syncope beginning earlier today. History of hypertension.  EXAM: MRI HEAD WITHOUT CONTRAST  MRA HEAD WITHOUT CONTRAST  TECHNIQUE: Multiplanar, multiecho pulse sequences of the brain and surrounding structures were obtained without intravenous contrast. Angiographic images of the head were obtained using MRA technique without contrast.  COMPARISON:  CT head earlier in the day.  FINDINGS: Image quality is reduced because of the patient's body habitus (397 lb.) Non standard flex coil was used to obtain the images.  MRI HEAD FINDINGS  Calvarium and upper cervical spine: Diffusely abnormal marrow signal likely reflecting anemia or  obesity. No worrisome focal lesions.  Orbits: No significant findings.  Sinuses: Clear. Mastoid and middle ears are clear.  Brain: No acute abnormality such as acute infarct, hemorrhage, hydrocephalus, or mass lesion. No evidence of large vessel occlusion. Normal cerebral volume with no white matter disease.  MRA HEAD FINDINGS  No stenosis or occlusion of the internal carotid arteries, or basilar artery. Both vertebrals contribute to formation of basilar, with the LEFT dominant. There is no intracranial stenosis or aneurysm.  IMPRESSION: No evidence for stroke or significant vascular lesion.  Diffusely abnormal  upper cervical marrow, nonspecific.   Electronically Signed   By: Rolla Flatten M.D.   On: 08/28/2014 20:09   Mr Jodene Nam Head/brain Wo Cm  08/28/2014   CLINICAL DATA:  Acute onset of RIGHT upper extremity weakness and syncope beginning earlier today. History of hypertension.  EXAM: MRI HEAD WITHOUT CONTRAST  MRA HEAD WITHOUT CONTRAST  TECHNIQUE: Multiplanar, multiecho pulse sequences of the brain and surrounding structures were obtained without intravenous contrast. Angiographic images of the head were obtained using MRA technique without contrast.  COMPARISON:  CT head earlier in the day.  FINDINGS: Image quality is reduced because of the patient's body habitus (397 lb.) Non standard flex coil was used to obtain the images.  MRI HEAD FINDINGS  Calvarium and upper cervical spine: Diffusely abnormal marrow signal likely reflecting anemia or obesity. No worrisome focal lesions.  Orbits: No significant findings.  Sinuses: Clear. Mastoid and middle ears are clear.  Brain: No acute abnormality such as acute infarct, hemorrhage, hydrocephalus, or mass lesion. No evidence of large vessel occlusion. Normal cerebral volume with no white matter disease.  MRA HEAD FINDINGS  No stenosis or occlusion of the internal carotid arteries, or basilar artery. Both vertebrals contribute to formation of basilar, with the LEFT  dominant. There is no intracranial stenosis or aneurysm.  IMPRESSION: No evidence for stroke or significant vascular lesion.  Diffusely abnormal  upper cervical marrow, nonspecific.   Electronically Signed   By: Rolla Flatten M.D.   On: 08/28/2014 20:09    Microbiology: No results found for this or any previous visit (from the past 240 hour(s)).   Labs: Basic Metabolic Panel:  Recent Labs Lab 08/28/14 1214 08/28/14 1222  NA 138 140  K 4.2 4.1  CL 107 104  CO2 27  --   GLUCOSE 105* 105*  BUN 9 9  CREATININE 0.78 0.70  CALCIUM 9.1  --    Liver Function Tests:  Recent Labs Lab 08/28/14 1214  AST 16  ALT 14  ALKPHOS 87  BILITOT 0.4  PROT 7.1  ALBUMIN 3.7    Recent Labs Lab 08/28/14 1808  LIPASE 23   No results for input(s): AMMONIA in the last 168 hours. CBC:  Recent Labs Lab 08/28/14 1214 08/28/14 1222  WBC 9.9  --   NEUTROABS 5.9  --   HGB 12.0 14.6  HCT 38.9 43.0  MCV 70.0*  --   PLT 380  --    Cardiac Enzymes:  Recent Labs Lab 08/28/14 1808 08/29/14 0010 08/29/14 0619  TROPONINI <0.03 <0.03 <0.03   BNP: BNP (last 3 results) No results for input(s): BNP in the last 8760 hours.  ProBNP (last 3 results) No results for input(s): PROBNP in the last 8760 hours.  CBG: No results for input(s): GLUCAP in the last 168 hours.     Signed:  Niel Hummer A  [facility] 08/29/2014, 3:52 PM

## 2014-08-29 NOTE — Progress Notes (Signed)
*  PRELIMINARY RESULTS* Vascular Ultrasound Carotid Duplex (Doppler) has been completed.   Findings suggest 1-39% internal carotid artery stenosis bilaterally. Vertebral arteries are patent with antegrade flow.  08/29/2014 11:00 AM Maudry Mayhew, RVT, RDCS, RDMS

## 2014-08-29 NOTE — Evaluation (Signed)
Physical Therapy Evaluation Patient Details Name: Kiona Blume MRN: [id number] DOB: [birth year] Today's Date: 08/29/2014   History of Present Illness  44 year old morbidly obese female with history of hypertension not on any medications for the past 1 year presented to the ED after having an acute onset right-extremity weakness and numbness and syncope at work. Patient reports that when she came to work this morning she developed numbness in her hand and then involving her entire right arm and was unable to move. She then had cough with 2 episodes of vomiting following which she had a brief syncope lasting for few seconds. When she woke up her right arm still felt numb and weak. She then started having pain in her right arm and substernal area.she reports having occasional chest pain and right hand numbness but had similar symptoms before. CT and MRI neg.   Clinical Impression  Pt admitted with above diagnosis. Pt currently with functional limitations due to the deficits listed below (see PT Problem List). Pt with antalgic gait pattern, unsafe ambulation without bariatric RW, though holding RW aggravates left UE pain. Pt also with mild dizziness and instability in standing.  Pt will benefit from skilled PT to increase their independence and safety with mobility to allow discharge to the venue listed below.       Follow Up Recommendations No PT follow up, unless indicated by outpt ortho if pt has sx for bakers cyst    Equipment Recommendations  Rolling walker with 5" wheels;Other (comment) (bariatric)    Recommendations for Other Services OT consult     Precautions / Restrictions Precautions Precautions: Fall Precaution Comments: pt has recently had baker's cyst dx right knee. This is painful and is making gait abnormal Restrictions Weight Bearing Restrictions: No      Mobility  Bed Mobility Overal bed mobility: Modified Independent                Transfers Overall transfer  level: Needs assistance Equipment used: None Transfers: Sit to/from Stand Sit to Stand: Min assist         General transfer comment: min A to steady as pt with mild dizziness upon standing. Pt with decreased balance due to pain behind left knee. Used RW for subsequent transfers  Ambulation/Gait Ambulation/Gait assistance: Museum/gallery curator (Feet): 25 Feet Assistive device: Rolling walker (2 wheeled) Gait Pattern/deviations: Antalgic;Wide base of support Gait velocity: decreased Gait velocity interpretation: Below normal speed for age/gender General Gait Details: pt with wide BOS due to body mass, antalgic pattern with decreased wt bearing left side. Took a few steps without AD but pt nearly stumbling and bariatric RW used next 20'. Pt had more dificulty with bkwd walking. Increased fall risk  Stairs            Wheelchair Mobility    Modified Rankin (Stroke Patients Only) Modified Rankin (Stroke Patients Only) Pre-Morbid Rankin Score: No symptoms Modified Rankin: Moderate disability     Balance Overall balance assessment: Needs assistance Sitting-balance support: No upper extremity supported;Feet supported Sitting balance-Leahy Scale: Good     Standing balance support: No upper extremity supported;During functional activity Standing balance-Leahy Scale: Fair Standing balance comment: pt can stand without UE assist but is unsteady and though RW flared up pain in left wrist, she is currently unsafe ambulating without an AD                             Pertinent Vitals/Pain Pain  Assessment: Faces Faces Pain Scale: Hurts even more Pain Location: RUE Pain Descriptors / Indicators: Aching;Burning Pain Intervention(s): Monitored during session  VSS    Home Living Family/patient expects to be discharged to:: Private residence Living Arrangements: Spouse/significant other Available Help at Discharge: Family;Available PRN/intermittently Type of  Home: House Home Access: Stairs to enter Entrance Stairs-Rails: None Entrance Stairs-Number of Steps: 2 Home Layout: One level Home Equipment: None 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 Neurologist questioning CTS.     Hand Dominance        Extremity/Trunk Assessment   Upper Extremity Assessment: RUE deficits/detail;Defer to OT evaluation RUE Deficits / Details: motion WFL, pain at right wrist, elbow, and shoulder, exacerbated by use of RW RUE: Unable to fully assess due to pain       Lower Extremity Assessment: RLE deficits/detail RLE Deficits / Details: knee ext 3+/5, flex 3/5, painful with extension and flexion    Cervical / Trunk Assessment: Normal  Communication   Communication: No difficulties  Cognition Arousal/Alertness: Awake/alert Behavior During Therapy: WFL for tasks assessed/performed Overall Cognitive Status: Within Functional Limits for tasks assessed                      General Comments      Exercises        Assessment/Plan    PT Assessment Patient needs continued PT services  PT Diagnosis Difficulty walking;Abnormality of gait;Acute pain   PT Problem List Decreased strength;Decreased range of motion;Decreased activity tolerance;Decreased balance;Decreased mobility;Decreased knowledge of precautions;Decreased knowledge of use of DME;Pain;Obesity  PT Treatment Interventions Gait training;DME instruction;Stair training;Functional mobility training;Therapeutic activities;Therapeutic exercise;Balance training;Patient/family education   PT Goals (Current goals can be found in the Care Plan section) Acute Rehab PT Goals Patient Stated Goal: figure out what's wrong PT Goal Formulation: With patient Time For Goal Achievement: 09/12/14 Potential to Achieve Goals:  Fair    Frequency Min 3X/week   Barriers to discharge Decreased caregiver support husband works    Co-evaluation               End of Session   Activity Tolerance: Patient limited by pain Patient left: in bed;with call bell/phone within reach;with family/visitor present Nurse Communication: Mobility status;Patient requests pain meds;Precautions    Functional Assessment Tool Used: clinical judgement Functional Limitation: Mobility: Walking and moving around Mobility: Walking and Moving Around Current Status [phone]): At least 1 percent but less than 20 percent impaired, limited or restricted Mobility: Walking and Moving Around Goal Status [phone]): 0 percent impaired, limited or restricted    Time: 1235-1310 PT Time Calculation (min) (ACUTE ONLY): 35 min   Charges:   PT Evaluation $Initial PT Evaluation Tier I: 1 Procedure PT Treatments $Gait Training: 8-22 mins   PT G Codes:   PT G-Codes **NOT FOR INPATIENT CLASS** Functional Assessment Tool Used: clinical judgement Functional Limitation: Mobility: Walking and moving around Mobility: Walking and Moving Around Current Status ([id number]): At least 1 percent but less than 20 percent impaired, limited or restricted Mobility: Walking and Moving Around Goal Status [phone]): 0 percent impaired, limited or restricted   Leighton Roach, [place], [place] 08/29/2014, 2:00 PM

## 2014-08-29 NOTE — Progress Notes (Signed)
Patient [place] home via car with spouse.  DC instructions and prescription given to patient and fully understood.  Additionally, she received note from MD to return to work on 09/03/14.  Vital signs and assessments were stable.

## 2014-08-29 NOTE — Progress Notes (Signed)
  Echocardiogram 2D Echocardiogram with Bubble Study has been performed.  Angela Tucker 08/29/2014, 10:18 AM

## 2014-08-29 NOTE — Progress Notes (Signed)
STROKE TEAM PROGRESS NOTE   HISTORY Angela Tucker is an 44 y.o. female obesity, menorrhagia, right mild osteoarthritis, uterine fibroids, multigravida, HTN (not taking BP medications for 1 year per patient) presented to ED with code stroke. Prior to ED visit she had intermittent entire right arm numbness noted 2 days ago which was transient. Right arm numbness stopped and returned today while at work. At work she initially had a coughing spell, then a syncopal episode and fell from her chair but without LOC and though patient does not recall the episode. She reported right upper extremity numbness and pain which started between 11 and 11:15 and lasting longer this occurrence.  Date last known well: Date: 08/28/2014 Time last known well: Time: 11:00 prior to 11 am.  tPA Given: No: minimal deficits risks outweigh benefits  She was admitted to 4 [facility] for further evaluation and treatment.    SUBJECTIVE (INTERVAL HISTORY) Her family is at the bedside.  Overall she feels her condition has improved. No issues overnight. Pt recalled her symptoms with Dr. Erlinda Hong, and by description her symptoms more consistent with right carpal tunnel syndrome plus right cubital tunnel syndrome.  OBJECTIVE Temp:  [97.9 F (36.6 C)-99.3 F (37.4 C)] 98.1 F (36.7 C) (02/17 1158) Pulse Rate:  [71-96] 71 (02/17 1158) Cardiac Rhythm:  [-] Normal sinus rhythm (02/17 0748) Resp:  [12-24] 18 (02/17 1158) BP: (110-148)/(32-100) 113/69 mmHg (02/17 1158) SpO2:  [92 %-100 %] 98 % (02/17 1158) Weight:  [180.3 kg (397 lb 7.8 oz)] 180.3 kg (397 lb 7.8 oz) (02/16 1233)  No results for input(s): GLUCAP in the last 168 hours.  Recent Labs Lab 08/28/14 1214 08/28/14 1222  NA 138 140  K 4.2 4.1  CL 107 104  CO2 27  --   GLUCOSE 105* 105*  BUN 9 9  CREATININE 0.78 0.70  CALCIUM 9.1  --     Recent Labs Lab 08/28/14 1214  AST 16  ALT 14  ALKPHOS 87  BILITOT 0.4  PROT 7.1  ALBUMIN 3.7    Recent Labs Lab  08/28/14 1214 08/28/14 1222  WBC 9.9  --   NEUTROABS 5.9  --   HGB 12.0 14.6  HCT 38.9 43.0  MCV 70.0*  --   PLT 380  --     Recent Labs Lab 08/28/14 1808 08/29/14 0010 08/29/14 0619  TROPONINI <0.03 <0.03 <0.03    Recent Labs  08/28/14 1214  LABPROT 13.5  INR 1.02    Recent Labs  08/28/14 1329  COLORURINE YELLOW  LABSPEC 1.011  PHURINE 7.0  GLUCOSEU NEGATIVE  HGBUR NEGATIVE  BILIRUBINUR NEGATIVE  KETONESUR NEGATIVE  PROTEINUR NEGATIVE  UROBILINOGEN 0.2  NITRITE NEGATIVE  LEUKOCYTESUR NEGATIVE       Component Value Date/Time   CHOL 148 08/29/2014 0010   TRIG 76 08/29/2014 0010   HDL 32* 08/29/2014 0010   CHOLHDL 4.6 08/29/2014 0010   VLDL 15 08/29/2014 0010   LDLCALC 101* 08/29/2014 0010   No results found for: HGBA1C    Component Value Date/Time   LABOPIA NONE DETECTED 08/28/2014 1329   COCAINSCRNUR NONE DETECTED 08/28/2014 1329   LABBENZ NONE DETECTED 08/28/2014 1329   AMPHETMU NONE DETECTED 08/28/2014 1329   THCU NONE DETECTED 08/28/2014 1329   LABBARB NONE DETECTED 08/28/2014 1329     Recent Labs Lab 08/28/14 1214  ETH <5    Ct Head Wo Contrast  08/28/2014    IMPRESSION: 1. No acute intracranial findings.  No CT evidence of acute  stroke. 2. Left frontal sinus mucosal thickening versus mucous retention cyst.                    Ct Angio Chest Pe W/cm &/or Wo Cm  08/28/2014     IMPRESSION: No evidence of acute pulmonary embolus. No acute findings identified in the chest.      Mri and Mra Head/brain Wo Cm  08/28/2014   IMPRESSION: No evidence for stroke or significant vascular lesion.  Diffusely abnormal  upper cervical marrow, nonspecific.      CUS - Bilateral: 1-39% ICA stenosis. Vertebral artery flow is antegrade.  TTE - - Left ventricle: The cavity size was normal. Systolic function was normal. The estimated ejection fraction was in the range of 55% to 60%. Wall motion was normal; there were no regional wall motion  abnormalities. Features are consistent with a pseudonormal left ventricular filling pattern, with concomitant abnormal relaxation and increased filling pressure (grade 2 diastolic dysfunction).  Impressions: - No cardiac source of emboli was indentified.  PHYSICAL EXAM HEENT- Normocephalic, no lesions, without obvious abnormality. Normal external eye and conjunctiva. Normal TM's bilaterally. Normal external ears. Normal external nose, mucus membranes and septum. Normal pharynx. Cardiovascular- regular rate and rhythm, no murmurs pulses palpable throughout  Lungs- chest clear, no wheezing, rales, normal symmetric air entry,  Abdomen- soft, non-tender; bowel sounds normal; no masses, no organomegaly, obese Extremities- no edema, no cyanosis  Lymph-no gross adenopathy  Musculoskeletal-+right knee pain anterior and posterior, decreased ROM right knee, +right shoulder pain with ROM Skin-warm and dry, no hyperpigmentation, vitiligo, or suspicious lesions  Neurological Examination Mental Status: Alert, oriented, thought content appropriate. Speech fluent without evidence of aphasia. Able to follow 3 step commands without difficulty. Cranial Nerves: II: Discs flat bilaterally; Visual fields grossly normal, pupils equal, round, reactive to light and accommodation III,IV, VI: ptosis not present, extra-ocular motions intact bilaterally V,VII: smile symmetric, facial light touch sensation normal bilaterally VIII: hearing normal bilaterally IX,X: gag reflex present XI: bilateral shoulder shrug XII: midline tongue extension Motor: Right :Upper extremity 5/5Left: Upper extremity 5/5 Lower extremity 5/5Lower extremity 5/5 Tone and bulk:normal tone throughout; no atrophy noted Sensory: decreased sensation to light touch right arm and right V1 otherwise wnl Deep Tendon  Reflexes: 2+ and symmetric throughout Plantars: Right: downgoingLeft: downgoing Cerebellar: normal finger-to-nose, did not assess heel-to-shin test due to body habitus and pain in lower extremities right>left,  Gait: not assessed   Mildly positive Phalen's and Tinel's tests on the right hand/wrist.   ASSESSMENT/PLAN Ms. Angela Tucker is a 44 y.o. female with history of morbid obesity, HTN and anemia presenting with pre-syncope with chest pain and intermittent right hand numbness noted 2 weeks. She did not receive IV t-PA due to minimal symptoms.   Right carpal tunnel syndrome  Resultant  Symptoms resolved.  MRI/MRA  no acute abnormalities  2D Echo  unremarkable  LDL 101  HgbA1c pending  Lovenox for VTE prophylaxis  Diet Heart thin liquids  no antithrombotic prior to admission, now on aspirin 325 mg orally every day  Patient counseled to be compliant with her antithrombotic medications  Ongoing aggressive stroke risk factor management  Therapy recommendations:  home  Disposition:  Home  Will need outpt EMG/NCS for further evaluation.  Hypertension  Home meds:   Nil of note  Stable  Patient counseled to be compliant with her blood pressure medications  Hyperlipidemia  Home meds: nil of note  LDL 101, goal < 70  Add lipitor 20mg   daily  Continue lipitor on discharge  Diabetes  HgbA1c pending, goal < 7.0  Other Stroke Risk Factors Obesity, Body mass index is 68.2 kg/(m^2).   Hospital day # 1  Patient's chart and images have been reviewed. Patient seen and discussed with Dr. Erlinda Hong No other neurology recommendations at this time. We'll need to follow-up with outside neurology for possible EMG to determine if she has carpal tunnel syndrome.  Lanelle Bal, PA-C Department neurology [facility]  I, the attending vascular neurologist, have personally obtained a history, examined the patient, evaluated  laboratory data, individually viewed imaging studies and agree with radiology interpretations. I also discussed with Dr. Tyrell Antonio regarding her care plan. Together with the NP/PA, we formulated the assessment and plan of care which reflects our mutual decision.  I have made any additions or clarifications directly to the above note and agree with the findings and plan as currently documented.   44 yo F with PMH of HTN, morbid obesity admitted for pre-syncope episode with right hand and elbow numbness and tingling. Symptoms consistent with right carpal tunnel syndrome. Need work up with emg/ncs as outpt. Continue ASA and consider statin for high LDL. Weight loss.   Neurology will sign off. Please call with questions. Pt will follow up with Dr. Jaynee Eagles at [facility] in about 2 months. Thanks for the consult.   Rosalin Hawking, MD PhD Stroke Neurology 08/29/2014 10:47 PM                                                 To contact Stroke Continuity provider, please refer to [email or web address] After hours, contact General Neurology

## 2014-08-29 NOTE — Evaluation (Signed)
Occupational Therapy Evaluation Patient Details Name: Angela Tucker MRN: [id number] DOB: 12-01-1970 Today's Date: 08/29/2014    History of Present Illness 44 year old morbidly obese female with history of hypertension not on any medications for the past 1 year presented to the ED after having an acute onset right-extremity weakness and numbness and syncope at work. Patient reports that when she came to work this morning she developed numbness in her hand and then involving her entire right arm and was unable to move. She then had cough with 2 episodes of vomiting following which she had a brief syncope lasting for few seconds. When she woke up her right arm still felt numb and weak. She then started having pain in her right arm and substernal area.she reports having occasional chest pain and right hand numbness but had similar symptoms before. CT and MRI neg.    Clinical Impression   Pt admitted with above. She presents to OT with muscular tightness Rt shoulder and scap area as well as pain Rt shoulder and upper arm and tingling Rt hand.  She demonstrates decreased functional use Rt UE due to pain.  She was instructed in UE exercises.  Currently, she requires min A with BADLs.  Pt with pending discharge this pm.  Will defer establishment of OT goals due to this.     Follow Up Recommendations  Outpatient OT;Supervision/Assistance - 24 hour    Equipment Recommendations       Recommendations for Other Services       Precautions / Restrictions Precautions Precautions: Fall Precaution Comments: pt has recently had baker's cyst dx right knee. This is painful and is making gait abnormal      Mobility Bed Mobility Overal bed mobility: Modified Independent                Transfers Overall transfer level: Needs assistance Equipment used: Rolling walker (2 wheeled) Transfers: Sit to/from [facility] Sit to Stand: Min assist Stand pivot transfers: Min assist        General transfer comment: min A to steady     Balance Overall balance assessment: Needs assistance Sitting-balance support: Feet supported Sitting balance-Leahy Scale: Good     Standing balance support: During functional activity Standing balance-Leahy Scale: Poor Standing balance comment: see ADL comments                             ADL Overall ADL's : Needs assistance/impaired Eating/Feeding: Independent;Bed level   Grooming: Wash/dry hands;Wash/dry face;Brushing hair;Minimal assistance;Standing Grooming Details (indicate cue type and reason): Pt primarily using Lt. UE due to pain Rt. shoulder and increased tingling Rt hand when using Rt. UE Upper Body Bathing: Set up;Sitting   Lower Body Bathing: Moderate assistance;Sit to/from stand   Upper Body Dressing : Minimal assistance;Sitting Upper Body Dressing Details (indicate cue type and reason): assist threading Rt UE  Lower Body Dressing: Minimal assistance;Sit to/from stand   Toilet Transfer: Minimal assistance;Ambulation;Comfort height toilet;BSC;Grab bars;RW   Toileting- Clothing Manipulation and Hygiene: Minimal assistance;Sit to/from stand       Functional mobility during ADLs: Minimal assistance General ADL Comments: While standing to brush hair with Rt UE, pt reports increased pain as well as increase tingling of digits.  Pt turned to look at therapist then leaned heavily into therapist.  Pt states she felt dizzy, but did not loose consciousness and when BSC pulled up behind her, she was able to reach back with bil. UEs to  lower herself down     Vision     Perception     Praxis      Pertinent Vitals/Pain Pain Assessment: 0-10 Pain Score: 7  Pain Location: Rt UE, scap area Pain Descriptors / Indicators: Aching;Sharp Pain Intervention(s): Monitored during session;Repositioned;Heat applied;Relaxation     Hand Dominance Right   Extremity/Trunk Assessment Upper Extremity Assessment Upper  Extremity Assessment: RUE deficits/detail RUE Deficits / Details: Pt demontrates full AROM, but unable to sustain shoulder elevation due to increased pain.  Pt with tenderness and muscle tightness upper traps, levator and scalenes.     Lower Extremity Assessment Lower Extremity Assessment: Defer to PT evaluation   Cervical / Trunk Assessment Cervical / Trunk Assessment: Normal   Communication Communication Communication: No difficulties   Cognition Arousal/Alertness: Awake/alert Behavior During Therapy: WFL for tasks assessed/performed Overall Cognitive Status: Within Functional Limits for tasks assessed                     General Comments       Exercises Exercises: Other exercises Other Exercises Other Exercises: Pt performed 8 reps bil. shoulder flexion before fatiguing.  Pt instructed to perform x 10 3-4 times/day, to perform neck rotation 10x 3-4/x/day; and instructed her in scapular abduction stretches.     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       Prior Functioning/Environment Level of Independence: Independent             OT Diagnosis: Generalized weakness;Acute pain   OT Problem List: Decreased strength;Decreased range of motion;Decreased activity tolerance;Impaired balance (sitting and/or standing);Decreased knowledge of use of DME or AE;Obesity;Impaired UE functional use;Pain   OT Treatment/Interventions: Self-care/ADL training;Therapeutic exercise;DME  and/or AE instruction;Therapeutic activities;Patient/family education;Balance training    OT Goals(Current goals can be found in the care plan section) Acute Rehab OT Goals Patient Stated Goal: to reduce pain  OT Goal Formulation: With patient Time For Goal Achievement: 09/05/14 Potential to Achieve Goals: Good  OT Frequency: Min 2X/week   Barriers to D/C:            Co-evaluation              End of Session Equipment Utilized During Treatment: Surveyor, mining Communication: Mobility status;Other (comment) (and performance during eval )  Activity Tolerance: Patient limited by pain Patient left: in bed;with call bell/phone within reach;with family/visitor present   Time: 1440-1513 OT Time Calculation (min): 33 min Charges:  OT General Charges $OT Visit: 1 Procedure OT Evaluation $Initial OT Evaluation Tier I: 1 Procedure OT Treatments $Self Care/Home Management : 8-22 mins G-Codes: OT G-codes **NOT FOR INPATIENT CLASS** Functional Limitation: Self care Self Care Current Status ([id number]): At least 20 percent but less than 40 percent impaired, limited or restricted Self Care Goal Status ([id number]): At least 20 percent but less than 40 percent impaired, limited or restricted Self Care Discharge Status [phone]): At least 20 percent but less than 40 percent impaired, limited or restricted  Lahna Nath M 08/29/2014, 5:42 PM

## 2014-08-30 LAB — HEMOGLOBIN A1C
Hgb A1c MFr Bld: 6.4 % — ABNORMAL HIGH (ref 4.8–5.6)
Mean Plasma Glucose: 137 mg/dL

## 2014-09-09 DIAGNOSIS — M1711 Unilateral primary osteoarthritis, right knee: Secondary | ICD-10-CM | POA: Insufficient documentation

## 2014-09-10 ENCOUNTER — Encounter: Payer: Self-pay | Admitting: Family

## 2014-09-10 ENCOUNTER — Telehealth: Payer: Self-pay | Admitting: *Deleted

## 2014-09-10 ENCOUNTER — Ambulatory Visit (INDEPENDENT_AMBULATORY_CARE_PROVIDER_SITE_OTHER): Payer: 59 | Admitting: Family

## 2014-09-10 VITALS — BP 140/92 | HR 72 | Temp 97.9°F | Resp 18 | Ht 64.5 in | Wt 392.2 lb

## 2014-09-10 DIAGNOSIS — I10 Essential (primary) hypertension: Secondary | ICD-10-CM

## 2014-09-10 DIAGNOSIS — G5601 Carpal tunnel syndrome, right upper limb: Secondary | ICD-10-CM

## 2014-09-10 DIAGNOSIS — R55 Syncope and collapse: Secondary | ICD-10-CM

## 2014-09-10 DIAGNOSIS — E785 Hyperlipidemia, unspecified: Secondary | ICD-10-CM | POA: Insufficient documentation

## 2014-09-10 DIAGNOSIS — M25569 Pain in unspecified knee: Secondary | ICD-10-CM

## 2014-09-10 DIAGNOSIS — R7303 Prediabetes: Secondary | ICD-10-CM

## 2014-09-10 DIAGNOSIS — R7309 Other abnormal glucose: Secondary | ICD-10-CM

## 2014-09-10 MED ORDER — MULTI-VITAMIN/MINERALS PO TABS: 1.0000 | ORAL_TABLET | Freq: Every day | ORAL | Status: DC

## 2014-09-10 NOTE — Assessment & Plan Note (Signed)
To undergo EMG testing with neurology.

## 2014-09-10 NOTE — Telephone Encounter (Signed)
Pt brought form to today's visit requesting records pertaining to pt's recent illness / medical condition keeping her out of work at present. Per verbal from PCP, ok to send hospital discharge summary. Pt also requested letter keeping her out of work this week due to upcoming cardiology and neurology appts this week. Letter given to pt and she also requests that letter be faxed to [employer] [facility]. D/C summary and letter faxed to [phone].

## 2014-09-10 NOTE — Patient Instructions (Signed)
Please schedule a fasting physical at the front desk. Work hard on diet/exercise/weight loss. Welcome to [employer]!

## 2014-09-10 NOTE — Assessment & Plan Note (Signed)
Discussed diabetic diet, exercise, weight loss.

## 2014-09-10 NOTE — Assessment & Plan Note (Signed)
Discussed exercise, improving dietary choices.  Portion control, avoiding sugared beverages.  Counting calories using my fitness pal.

## 2014-09-10 NOTE — Assessment & Plan Note (Signed)
Vasovagal syncope.  Work up negative.  Monitor.

## 2014-09-10 NOTE — Progress Notes (Signed)
Subjective:    Patient ID: Angela Tucker, female    DOB: [birth year], 44 y.o.   MRN: [id number]  HPI  [name] is a 44 yr old female who presents today to establish care. She reports that she has not had a primary care and was recently hospitalized.  She was admitted 2/16-2/17 following a syncopal event and pleuritic chest pain. Records are reviewed. She apparently had onset of right upper extremity weakness and numbness and was followed by syncope at work.  Work up in hospital which included echo, carotid dopper and CT Angio chest were negative.  MRI was negative for stroke. Vasovagal syncope was suspected. It was felt that her right arm pain was secondary to carpal tunnel syndrome.    Has hx of baker cyst. Saw ortho Friday and had cortisone injection in the right knee.  Dr. Len Childs- [place].    Hyperlipidemia- Started on statin while in hospital by neurology   Lab Results  Component Value Date   CHOL 148 08/29/2014   HDL 32* 08/29/2014   LDLCALC 101* 08/29/2014   TRIG 76 08/29/2014   CHOLHDL 4.6 08/29/2014   HTN-   BP Readings from Last 3 Encounters:  09/10/14 140/92   Borderline DM- A1C 6.4- this is a new diagnosis.   24 hr recall Breakfast- 2 piece [place] bacon, spoonful eggs, spoonful grits. Juice- fruit punch small Applesauce Lunch-1/2 sandwich burger quesidilla applebees 1/2 of a bottle of green tea Dinner- salad, cabbage, baked chicken Juice  Sees [facility]    Review of Systems    see HPI  Past Medical History  Diagnosis Date  . Hypertension   . Anemia     History   Social History  . Marital Status: Married    Spouse Name: N/A  . Number of Children: N/A  . Years of Education: N/A   Occupational History  . Not on file.   Social History Main Topics  . Smoking status: Never Smoker   . Smokeless tobacco: Never Used  . Alcohol Use: 0.0 oz/week    0 Standard drinks or equivalent per week     Comment: occasional glass of wine  . Drug  Use: No  . Sexual Activity: Not on file   Other Topics Concern  . Not on file   Social History Narrative   Works at [employer]   Married   3 children   Born 1997- Son [name]   2002- [name] daughter   2004- [name] son   Working on her masters       Past Surgical History  Procedure Laterality Date  . Cesarean section      x 3    Family History  Problem Relation Age of Onset  . Cancer Mother     breast  . Hypertension Father   . Diabetes Father   . Diabetes Paternal Grandmother   . Hypertension Paternal Grandmother   . Diabetes Paternal Grandfather   . Hypertension Paternal Grandfather     Allergies  Allergen Reactions  . Penicillins Hives    Current Outpatient Prescriptions on File Prior to Visit  Medication Sig Dispense Refill  . atorvastatin (LIPITOR) 20 MG tablet Take 1 tablet (20 mg total) by mouth daily at 6 PM. 30 tablet 0  . traMADol-acetaminophen (ULTRACET) 37.5-325 MG per tablet Take 1 tablet by mouth every 6 (six) hours as needed for moderate pain. 30 tablet 0   No current facility-administered medications on file prior to visit.  BP 140/92 mmHg  Pulse 72  Temp(Src) 97.9 F (36.6 C) (Oral)  Resp 18  Ht 5' 4.5" (1.638 m)  Wt 392 lb 3.2 oz (177.901 kg)  BMI 66.31 kg/m2  SpO2 97%  LMP 09/03/2014     Objective:   Physical Exam  Constitutional: She is oriented to person, place, and time. She appears well-developed and well-nourished.  morbidly obese  HENT:  Head: Normocephalic and atraumatic.  Cardiovascular: Normal rate, regular rhythm and normal heart sounds.   No murmur heard. Pulmonary/Chest: Effort normal and breath sounds normal. No respiratory distress. She has no wheezes.  Musculoskeletal: She exhibits no edema.  Lymphadenopathy:    She has no cervical adenopathy.  Neurological: She is alert and oriented to person, place, and time.  Skin: Skin is warm and dry.  Psychiatric: She has a normal mood and affect. Her behavior is normal.  Judgment and thought content normal.          Assessment & Plan:  30 minutes spent with pt.  >50% of this time was spent counseling pt on diet, exercise and weight loss.

## 2014-09-10 NOTE — Assessment & Plan Note (Addendum)
BP looks borderline today.  Monitor. Consider bp med if still elevated next visit.

## 2014-09-10 NOTE — Assessment & Plan Note (Signed)
Management per ortho. We did discuss water aerobics for exercise/weight loss.

## 2014-09-10 NOTE — Assessment & Plan Note (Signed)
Now on statin, management per neuro.

## 2014-09-12 ENCOUNTER — Ambulatory Visit (INDEPENDENT_AMBULATORY_CARE_PROVIDER_SITE_OTHER): Payer: 59 | Admitting: Neurology

## 2014-09-12 ENCOUNTER — Ambulatory Visit (INDEPENDENT_AMBULATORY_CARE_PROVIDER_SITE_OTHER): Payer: Self-pay | Admitting: Neurology

## 2014-09-12 DIAGNOSIS — R202 Paresthesia of skin: Secondary | ICD-10-CM | POA: Diagnosis not present

## 2014-09-13 NOTE — Progress Notes (Signed)
  [facility]    Provider:  Dr Jaynee Eagles Referring Provider: Rosalin Hawking, MD Primary Care Physician:  Nance Pear., NP  History:  Angela Tucker is a 44 y.o. female here as a referral from Dr. Erlinda Hong for right arm pain and weakness. Patient reports symptoms started several weeks previous. She has tingling in all the fingers, whole arm tingling with pain and numbness. Denies current neck pain or radicular symptoms. She has nocturnal awakenings, has to shake out her hands at night and at work to try and get the feeling back. Focused exam with negative Tinel's Sign at the right wrist and elbows, positive Phalen's maneuver.   Summary: Nerve Conduction studies were performed on the right upper extremity. ADM Ulnar and APB Median motor conductions were within normal limits with normal F wave latencies.  2nd-digit Median, 5th-digit Ulnar and radial sensory conductions were within normal limits.  The right median-ulnar comparison study showed normal latency difference.  EMG needle evaluation was performed on selected right upper extremity muscles: The right Triceps showed moderately increased spontaneous activity, diminished motor unit recruitment. The right Deltoid, right Flexor Digitorum Profundus (ulnar), right Pronator Teres, right Extensor Digitorum Communis muscles were within normal limits. EMG needle exam was terminated prematurely at patient's request due to pain.  Conclusion: This is a limited study as EMG needle exam was terminated prematurely. Nerve conduction studies did not show evidence for median or ulnar neuropathy. Isolated acute/ongoing denervation and chronic neurogenic changes seen in the right Triceps muscle is of uncertain clinical significance given limited exam but can consider MRI of the neck as clinically warranted. Also may consider repeat emg/ncs in 6 weeks if symptoms persist or worsen as symptoms do clinically sound like Carpal Tunnel  Syndrome.   Angela Ill, MD  [facility] [address] [place],  [address]  Phone [phone] Fax [phone]

## 2014-09-16 NOTE — Procedures (Signed)
  [facility]    Provider:  Dr Jaynee Eagles Referring Provider: Rosalin Hawking, MD Primary Care Physician:  Nance Pear., NP  History:  Angela Tucker is a 44 y.o. female here as a referral from Dr. Erlinda Hong for right arm pain and weakness. Patient reports symptoms started several weeks previous. She has tingling in all the fingers, whole arm tingling with pain and numbness. Denies current neck pain or radicular symptoms. She has nocturnal awakenings, has to shake out her hands at night and at work to try and get the feeling back. Focused exam with negative Tinel's Sign at the right wrist and elbows, positive Phalen's maneuver.   Summary: Nerve Conduction studies were performed on the right upper extremity. ADM Ulnar and APB Median motor conductions were within normal limits with normal F wave latencies.  2nd-digit Median, 5th-digit Ulnar and radial sensory conductions were within normal limits.  The right median-ulnar comparison study showed normal latency difference.  EMG needle evaluation was performed on selected right upper extremity muscles: The right Triceps showed moderately increased spontaneous activity, diminished motor unit recruitment. The right Deltoid, right Flexor Digitorum Profundus (ulnar), right Pronator Teres, right Extensor Digitorum Communis muscles were within normal limits. EMG needle exam was terminated prematurely at patient's request due to pain.  Conclusion: This is a limited study as EMG needle exam was terminated prematurely. Nerve conduction studies did not show evidence for median or ulnar neuropathy. Isolated acute/ongoing denervation and chronic neurogenic changes seen in the right Triceps muscle is of uncertain clinical significance given limited exam but can consider MRI of the neck as clinically warranted. Also may consider repeat emg/ncs in 6 weeks if symptoms persist or worsen as symptoms do clinically sound like Carpal Tunnel  Syndrome.   Sarina Ill, MD  [facility] [address] [place], [place] [address]  Phone [phone] Fax [phone]

## 2014-09-16 NOTE — Patient Instructions (Signed)
Follow up with Dr. Erlinda Hong

## 2014-09-16 NOTE — Progress Notes (Signed)
See procedure note.

## 2014-09-18 ENCOUNTER — Other Ambulatory Visit (HOSPITAL_COMMUNITY): Payer: Self-pay | Admitting: Obstetrics and Gynecology

## 2014-09-18 DIAGNOSIS — Z1231 Encounter for screening mammogram for malignant neoplasm of breast: Secondary | ICD-10-CM

## 2014-09-25 ENCOUNTER — Ambulatory Visit (HOSPITAL_COMMUNITY)
Admission: RE | Admit: 2014-09-25 | Discharge: 2014-09-25 | Disposition: A | Discharge status: Home / Self Care | Payer: 59 | Source: Ambulatory Visit | Attending: Obstetrics and Gynecology | Admitting: Obstetrics and Gynecology

## 2014-09-25 ENCOUNTER — Other Ambulatory Visit (HOSPITAL_COMMUNITY): Payer: Self-pay | Admitting: Obstetrics and Gynecology

## 2014-09-25 DIAGNOSIS — Z1231 Encounter for screening mammogram for malignant neoplasm of breast: Secondary | ICD-10-CM

## 2014-09-26 LAB — HM PAP SMEAR

## 2014-09-27 ENCOUNTER — Telehealth: Payer: Self-pay | Admitting: Family

## 2014-09-27 NOTE — Telephone Encounter (Signed)
Pre Visit letter sent  °

## 2014-10-16 ENCOUNTER — Telehealth: Payer: Self-pay | Admitting: *Deleted

## 2014-10-16 ENCOUNTER — Encounter: Payer: Self-pay | Admitting: *Deleted

## 2014-10-16 NOTE — Telephone Encounter (Signed)
Pre-Visit Call completed with patient and chart updated.   Pre-Visit Info documented in Specialty Comments under Snap[employer]ot.    

## 2014-10-17 ENCOUNTER — Ambulatory Visit (INDEPENDENT_AMBULATORY_CARE_PROVIDER_SITE_OTHER): Payer: 59 | Admitting: Family

## 2014-10-17 ENCOUNTER — Encounter: Payer: Self-pay | Admitting: Family

## 2014-10-17 VITALS — BP 118/80 | HR 78 | Temp 98.0°F | Resp 16 | Ht 64.5 in | Wt 395.0 lb

## 2014-10-17 DIAGNOSIS — Z23 Encounter for immunization: Secondary | ICD-10-CM

## 2014-10-17 DIAGNOSIS — R718 Other abnormality of red blood cells: Secondary | ICD-10-CM

## 2014-10-17 DIAGNOSIS — Z0000 Encounter for general adult medical examination without abnormal findings: Secondary | ICD-10-CM

## 2014-10-17 DIAGNOSIS — Z1211 Encounter for screening for malignant neoplasm of colon: Secondary | ICD-10-CM | POA: Insufficient documentation

## 2014-10-17 LAB — URINALYSIS, ROUTINE W REFLEX MICROSCOPIC
BILIRUBIN URINE: NEGATIVE
Ketones, ur: NEGATIVE
Leukocytes, UA: NEGATIVE
Nitrite: NEGATIVE
Specific Gravity, Urine: 1.015 (ref 1.000–1.030)
URINE GLUCOSE: NEGATIVE
Urobilinogen, UA: 0.2 (ref 0.0–1.0)
pH: 7.5 (ref 5.0–8.0)

## 2014-10-17 LAB — TSH: TSH: 0.98 u[IU]/mL (ref 0.35–4.50)

## 2014-10-17 LAB — BASIC METABOLIC PANEL
BUN: 10 mg/dL (ref 6–23)
CHLORIDE: 104 meq/L (ref 96–112)
CO2: 31 mEq/L (ref 19–32)
CREATININE: 0.8 mg/dL (ref 0.40–1.20)
Calcium: 9.2 mg/dL (ref 8.4–10.5)
GFR: 100.01 mL/min (ref >60.00)
Glucose, Bld: 97 mg/dL (ref 70–99)
Potassium: 3.9 mEq/L (ref 3.5–5.1)
SODIUM: 138 meq/L (ref 135–145)

## 2014-10-17 LAB — IRON: IRON: 22 ug/dL — AB (ref 42–145)

## 2014-10-17 LAB — HEPATIC FUNCTION PANEL
ALBUMIN: 3.4 g/dL — AB (ref 3.5–5.2)
ALT: 10 U/L (ref 0–35)
AST: 9 U/L (ref 0–37)
Alkaline Phosphatase: 84 U/L (ref 39–117)
Bilirubin, Direct: 0.1 mg/dL (ref 0.0–0.3)
Total Bilirubin: 0.4 mg/dL (ref 0.2–1.2)
Total Protein: 6.9 g/dL (ref 6.0–8.3)

## 2014-10-17 NOTE — Patient Instructions (Signed)
Please complete lab work prior to leaving. Continue your exercise, try increasing to 5 days a week. Work on weight loss- try setting up plan on my fitness pal and tracking your calories with goal of 1-2 pound weight loss a day. Follow up in 4 months.

## 2014-10-17 NOTE — Assessment & Plan Note (Signed)
Discussed healthy diet, exercise, weight loss. Due to tdap today.

## 2014-10-17 NOTE — Progress Notes (Signed)
Subjective:    Patient ID: Angela Tucker, female    DOB: 20-May-1971, 44 y.o.   MRN: [id number]  HPI  Patient presents today for complete physical.  Immunizations: due for tdap Diet: she has started drinking water instead of soda.  Lots fruits/veggies,  Exercise: water aerobics 2 x a week Colonoscopy: will start at 49 Pap Smear: 3/16 Mammogram: 3/16    Review of Systems  Constitutional: Negative for unexpected weight change.  HENT: Positive for rhinorrhea. Negative for hearing loss.   Eyes: Negative for visual disturbance.  Respiratory: Negative for cough.   Cardiovascular: Negative for leg swelling.  Gastrointestinal: Negative for nausea, diarrhea and constipation.  Genitourinary: Positive for menstrual problem. Negative for dysuria and frequency.       Irreg menstrual cycles, following with GYN  Musculoskeletal: Negative for myalgias and arthralgias.  Skin: Negative for rash.  Neurological: Negative for headaches.  Hematological: Negative for adenopathy.  Psychiatric/Behavioral: Negative for dysphoric mood and agitation.       Past Medical History  Diagnosis Date  . Hypertension   . Anemia     History   Social History  . Marital Status: Married    Spouse Name: N/A  . Number of Children: N/A  . Years of Education: N/A   Occupational History  . Not on file.   Social History Main Topics  . Smoking status: Never Smoker   . Smokeless tobacco: Never Used  . Alcohol Use: 0.0 oz/week    0 Standard drinks or equivalent per week     Comment: occasional glass of wine  . Drug Use: No  . Sexual Activity: Not on file   Other Topics Concern  . Not on file   Social History Narrative   Works at [employer]   Married   3 children   Born 1997- Son [name]   2002- [name] daughter   2004- [name] son   Working on her masters       Past Surgical History  Procedure Laterality Date  . Cesarean section      x 3    Family History  Problem Relation Age of Onset  .  Cancer Mother     breast  . Hypertension Father   . Diabetes Father   . Diabetes Paternal Grandmother   . Hypertension Paternal Grandmother   . Diabetes Paternal Grandfather   . Hypertension Paternal Grandfather     Allergies  Allergen Reactions  . Penicillins Hives    Current Outpatient Prescriptions on File Prior to Visit  Medication Sig Dispense Refill  . atorvastatin (LIPITOR) 20 MG tablet Take 1 tablet (20 mg total) by mouth daily at 6 PM. 30 tablet 0  . ibuprofen (ADVIL,MOTRIN) 800 MG tablet Take 1 tablet by mouth. Has not started yet  0  . Multiple Vitamins-Minerals (MULTIVITAMIN WITH MINERALS) tablet Take 1 tablet by mouth daily.    . traMADol-acetaminophen (ULTRACET) 37.5-325 MG per tablet Take 1 tablet by mouth every 6 (six) hours as needed for moderate pain. (Patient not taking: Reported on 10/16/2014) 30 tablet 0   No current facility-administered medications on file prior to visit.    BP 118/80 mmHg  Pulse 78  Temp(Src) 98 F (36.7 C) (Oral)  Resp 16  Ht 5' 4.5" (1.638 m)  Wt 395 lb (179.171 kg)  BMI 66.78 kg/m2  SpO2 99%  LMP 10/17/2014    Objective:   Physical Exam  Physical Exam  Constitutional: Morbidly obese AA female She is oriented to  person, place, and time. She appears well-developed and well-nourished. No distress.  HENT:  Head: Normocephalic and atraumatic.  Right Ear: Tympanic membrane and ear canal normal.  Left Ear: Tympanic membrane and ear canal normal.  Mouth/Throat: Oropharynx is clear and moist.  Eyes: Pupils are equal, round, and reactive to light. No scleral icterus.  Neck: Normal range of motion. No thyromegaly present.  Cardiovascular: Normal rate and regular rhythm.   No murmur heard. Pulmonary/Chest: Effort normal and breath sounds normal. No respiratory distress. He has no wheezes. She has no rales. She exhibits no tenderness.  Abdominal: Soft. Bowel sounds are normal. He exhibits no distension and no mass. There is no  tenderness. There is no rebound and no guarding.  Musculoskeletal: She exhibits no edema.  Lymphadenopathy:    She has no cervical adenopathy.  Neurological: She is alert and oriented to person, place, and time.She exhibits normal muscle tone. Coordination normal.  Skin: Skin is warm and dry.  Psychiatric: She has a normal mood and affect. Her behavior is normal. Judgment and thought content normal.  Breast/pelvic- deferred          Assessment & Plan:         Assessment & Plan:

## 2014-10-17 NOTE — Progress Notes (Signed)
Pre visit review using our clinic review tool, if applicable. No additional management support is needed unless otherwise documented below in the visit note. 

## 2014-10-18 ENCOUNTER — Telehealth: Payer: Self-pay | Admitting: Family

## 2014-10-18 LAB — HIV ANTIBODY (ROUTINE TESTING W REFLEX): HIV 1&2 Ab, 4th Generation: NONREACTIVE

## 2014-10-18 NOTE — Telephone Encounter (Signed)
Iron level is low.  Add iron 325mg  one tab by mouth once daily.   Repeat iron and cbc in 3 months.  HIV negative, Other labs look good.

## 2014-10-19 NOTE — Telephone Encounter (Signed)
Notified pt and she voices understanding. Pt would like to complete below labs at upcoming f/u on 02/18/15.

## 2015-02-18 ENCOUNTER — Ambulatory Visit: Payer: 59 | Admitting: Family

## 2015-02-20 ENCOUNTER — Telehealth: Payer: Self-pay | Admitting: Family

## 2015-02-20 NOTE — Telephone Encounter (Signed)
Pt was no show 02/18/15 3:30pm, 4 months f/u appt, left msg for pt to reschedule, charge for no show?

## 2015-02-20 NOTE — Telephone Encounter (Signed)
Yes please

## 2015-10-09 LAB — HM MAMMOGRAPHY: HM MAMMO: NORMAL (ref 0–4)

## 2015-10-09 LAB — HM PAP SMEAR: HM PAP: NORMAL

## 2016-01-20 ENCOUNTER — Encounter: Payer: 59 | Admitting: Family

## 2016-02-19 ENCOUNTER — Encounter: Payer: Self-pay | Admitting: Family

## 2016-02-19 ENCOUNTER — Ambulatory Visit (INDEPENDENT_AMBULATORY_CARE_PROVIDER_SITE_OTHER): Payer: 59 | Admitting: Family

## 2016-02-19 VITALS — BP 130/92 | HR 68 | Temp 97.6°F | Resp 16 | Ht 65.0 in | Wt >= 6400 oz

## 2016-02-19 DIAGNOSIS — R400 Somnolence: Secondary | ICD-10-CM

## 2016-02-19 DIAGNOSIS — Z0000 Encounter for general adult medical examination without abnormal findings: Secondary | ICD-10-CM

## 2016-02-19 DIAGNOSIS — G4710 Hypersomnia, unspecified: Secondary | ICD-10-CM

## 2016-02-19 DIAGNOSIS — R358 Other polyuria: Secondary | ICD-10-CM

## 2016-02-19 DIAGNOSIS — R3589 Other polyuria: Secondary | ICD-10-CM

## 2016-02-19 LAB — CBC WITH DIFFERENTIAL/PLATELET
HCT: 35.3 % — ABNORMAL LOW (ref 36.0–46.0)
HEMOGLOBIN: 11.2 g/dL — AB (ref 12.0–15.0)
MCHC: 31.8 g/dL (ref 30.0–36.0)
MCV: 72.9 fl — ABNORMAL LOW (ref 78.0–100.0)
PLATELETS: 390 10*3/uL (ref 150.0–400.0)
RBC: 4.84 Mil/uL (ref 3.87–5.11)
RDW: 18.3 % — ABNORMAL HIGH (ref 11.5–15.5)
WBC: 8.6 10*3/uL (ref 4.0–10.5)

## 2016-02-19 LAB — HEPATIC FUNCTION PANEL
ALK PHOS: 82 U/L (ref 39–117)
ALT: 10 U/L (ref 0–35)
AST: 12 U/L (ref 0–37)
Albumin: 3.6 g/dL (ref 3.5–5.2)
Bilirubin, Direct: 0.1 mg/dL (ref 0.0–0.3)
Total Bilirubin: 0.4 mg/dL (ref 0.2–1.2)
Total Protein: 7 g/dL (ref 6.0–8.3)

## 2016-02-19 LAB — URINALYSIS, ROUTINE W REFLEX MICROSCOPIC
BILIRUBIN URINE: NEGATIVE
Hgb urine dipstick: NEGATIVE
KETONES UR: NEGATIVE
LEUKOCYTES UA: NEGATIVE
Nitrite: NEGATIVE
PH: 6 (ref 5.0–8.0)
RBC / HPF: NONE SEEN (ref 0–?)
Specific Gravity, Urine: >=1.03 — AB (ref 1.000–1.030)
Total Protein, Urine: NEGATIVE
Urine Glucose: NEGATIVE
Urobilinogen, UA: 0.2 (ref 0.0–1.0)

## 2016-02-19 LAB — TSH: TSH: 2.02 u[IU]/mL (ref 0.35–4.50)

## 2016-02-19 LAB — BASIC METABOLIC PANEL
BUN: 7 mg/dL (ref 6–23)
CO2: 32 mEq/L (ref 19–32)
Calcium: 9.3 mg/dL (ref 8.4–10.5)
Chloride: 104 mEq/L (ref 96–112)
Creatinine, Ser: 0.74 mg/dL (ref 0.40–1.20)
GFR: 108.77 mL/min (ref >60.00)
Glucose, Bld: 101 mg/dL — ABNORMAL HIGH (ref 70–99)
POTASSIUM: 3.8 meq/L (ref 3.5–5.1)
SODIUM: 140 meq/L (ref 135–145)

## 2016-02-19 LAB — LIPID PANEL
Cholesterol: 169 mg/dL (ref 0–200)
HDL: 38.7 mg/dL — ABNORMAL LOW (ref >39.00)
LDL Cholesterol: 117 mg/dL — ABNORMAL HIGH (ref 0–99)
NonHDL: 130.33
Total CHOL/HDL Ratio: 4
Triglycerides: 69 mg/dL (ref 0.0–149.0)
VLDL: 13.8 mg/dL (ref 0.0–40.0)

## 2016-02-19 LAB — HEMOGLOBIN A1C: Hgb A1c MFr Bld: 5.7 % (ref 4.6–6.5)

## 2016-02-19 NOTE — Progress Notes (Signed)
Subjective:    Patient ID: Angela Tucker, female    DOB: 1971/05/19, 45 y.o.   MRN: [id number]  HPI  Angela Tucker is a 45 yr old female who presents today for cpx.  Immunizations: up to date Diet: reports that she has been eating later in the evening due to her work schedule. Drinking water.  Drinks 1 soda a day.  1 sweet tea, 1 20 oz soda.  Exercise: doing water aerobics 2 times a week Pap Smear: 10/09/15 [facility] Mammogram: 10/09/15 Vision: due Dental: up to date Wt Readings from Last 3 Encounters:  02/19/16 (!) 405 lb 12.8 oz (184.1 kg)  10/17/14 (!) 395 lb (179.2 kg)  09/10/14 (!) 392 lb 3.2 oz (177.9 kg)   + Polyuria- notes after concentrated sweets.  + daytime somnolence- unsure if she snores, can doze off easily during the day. "tired all the time."  Review of Systems  Constitutional: Negative for unexpected weight change.       Some daytime somnolence.   HENT: Negative for rhinorrhea.   Eyes:       Some issues with reading- plans to schedule visit with eye doctor.  Respiratory: Negative for cough.   Cardiovascular: Negative for leg swelling.  Gastrointestinal: Negative for constipation and diarrhea.  Endocrine:       Notes + poluria after eating sweets.  Genitourinary: Negative for dysuria and frequency.  Musculoskeletal:       Chronic knee pain  Skin: Negative for rash.  Neurological: Negative for headaches.  Hematological: Negative for adenopathy.  Psychiatric/Behavioral:       Denies depression/anxiety       Past Medical History:  Diagnosis Date  . Anemia   . Hypertension      Social History   Social History  . Marital status: Married    Spouse name: N/A  . Number of children: N/A  . Years of education: N/A   Occupational History  . Not on file.   Social History Main Topics  . Smoking status: Never Smoker  . Smokeless tobacco: Never Used  . Alcohol use 0.0 oz/week     Comment: occasional glass of wine  . Drug use: No  .  Sexual activity: Not on file   Other Topics Concern  . Not on file   Social History Narrative   Works at [employer]   Married   3 children   Born 1997- Son [name]   2002- [name] daughter   2004- [name] son   Working on her masters       Past Surgical History:  Procedure Laterality Date  . CESAREAN SECTION     x 3    Family History  Problem Relation Age of Onset  . Cancer Mother     breast  . Hypertension Father   . Diabetes Father   . Diabetes Paternal Grandmother   . Hypertension Paternal Grandmother   . Diabetes Paternal Grandfather   . Hypertension Paternal Grandfather     Allergies  Allergen Reactions  . Atorvastatin Nausea Only  . Penicillins Hives    Current Outpatient Prescriptions on File Prior to Visit  Medication Sig Dispense Refill  . Multiple Vitamins-Minerals (MULTIVITAMIN WITH MINERALS) tablet Take 1 tablet by mouth daily.     No current facility-administered medications on file prior to visit.     BP (!) 140/92   Pulse 68   Temp 97.6 F (36.4 C) (Oral)   Resp 16   Ht 5\' 5"  (  1.651 m)   Wt (!) 405 lb 12.8 oz (184.1 kg)   LMP 12/12/2015   SpO2 98% Comment: room air  BMI 67.53 kg/m    Objective:   Physical Exam  Physical Exam  Constitutional: Morbidly obese AA female She is oriented to person, place, and time. She appears well-developed and well-nourished. No distress.  HENT: Multiple missing teeth Head: Normocephalic and atraumatic.  Right Ear: Tympanic membrane and ear canal normal.  Left Ear: Tympanic membrane and ear canal normal.  Mouth/Throat: Oropharynx is clear and moist.  Eyes: Pupils are equal, round, and reactive to light. No scleral icterus.  Neck: Normal range of motion. No thyromegaly present.  Cardiovascular: Normal rate and regular rhythm.   No murmur heard. Pulmonary/Chest: Effort normal and breath sounds normal. No respiratory distress. He has no wheezes. She has no rales. She exhibits no tenderness.  Abdominal: Soft.  Bowel sounds are normal. She exhibits no distension and no mass. There is no tenderness. There is no rebound and no guarding.  Musculoskeletal: She exhibits no edema.  Lymphadenopathy:    She has no cervical adenopathy.  Neurological: She is alert and oriented to person, place, and time. She exhibits normal muscle tone. Coordination normal.  Skin: Skin is warm and dry.  Psychiatric: She has a normal mood and affect. Her behavior is normal. Judgment and thought content normal.        Assessment & Plan:         Assessment & Plan:   Preventative care- Immunizations reviewed and up to date. Advised patient as follows:  Please work on healthy diet and weight loss. Use my fitnesspal to track your calories and stay under 1990 calories a day. (a referral was made to nutrition). Plan follow up in 6 weeks for reweigh and BP recheck dbp is elevated today. Complete lab work prior to leaving. Schedule eye exam.     EKG tracing is personally reviewed.  EKG notes NSR.  No acute changes.   Daytime somnolence- given morbid obesity, very concerning for OSA. Will order a home sleep study.  Polyuria- obtain A1C.

## 2016-02-19 NOTE — Patient Instructions (Addendum)
You will be contacted about your sleep study and your referral to nutrition. Please work on healthy diet and weight loss. Use my fitnesspal to track your calories and stay under 1990 calories a day.  Complete lab work prior to leaving. Schedule eye exam.

## 2016-02-21 ENCOUNTER — Other Ambulatory Visit (INDEPENDENT_AMBULATORY_CARE_PROVIDER_SITE_OTHER): Payer: 59

## 2016-02-21 DIAGNOSIS — D649 Anemia, unspecified: Secondary | ICD-10-CM

## 2016-02-21 LAB — IBC PANEL
IRON: 18 ug/dL — AB (ref 42–145)
SATURATION RATIOS: 5.2 % — AB (ref 20.0–50.0)
TRANSFERRIN: 245 mg/dL (ref 212.0–360.0)

## 2016-02-26 ENCOUNTER — Telehealth: Payer: Self-pay | Admitting: Family

## 2016-02-26 DIAGNOSIS — D649 Anemia, unspecified: Secondary | ICD-10-CM

## 2016-02-26 NOTE — Telephone Encounter (Signed)
Iron level is low and she is anemic. I would like her to repeat cbc, iron, Ferritin in 3 months. Add iron 325mg  PO BID.   Other labs look good.

## 2016-03-03 NOTE — Telephone Encounter (Signed)
LMOM informing Pt to return call.  

## 2016-03-04 NOTE — Telephone Encounter (Signed)
Sent pt message via mychart. Future lab orders entered.

## 2016-03-09 DIAGNOSIS — G4733 Obstructive sleep apnea (adult) (pediatric): Secondary | ICD-10-CM

## 2016-03-10 DIAGNOSIS — G4733 Obstructive sleep apnea (adult) (pediatric): Secondary | ICD-10-CM

## 2016-03-11 ENCOUNTER — Other Ambulatory Visit: Payer: Self-pay | Admitting: *Deleted

## 2016-03-11 ENCOUNTER — Encounter: Payer: Self-pay | Admitting: Family

## 2016-03-11 ENCOUNTER — Telehealth: Payer: Self-pay | Admitting: Family

## 2016-03-11 DIAGNOSIS — R400 Somnolence: Secondary | ICD-10-CM

## 2016-03-11 DIAGNOSIS — G4733 Obstructive sleep apnea (adult) (pediatric): Secondary | ICD-10-CM | POA: Insufficient documentation

## 2016-03-11 NOTE — Telephone Encounter (Signed)
Attempted to reach pt by phone and left message to check mychart acct. Message sent via mychart. 

## 2016-03-11 NOTE — Telephone Encounter (Signed)
Sleep study shows mild OSA. Since she is tired all the time, I think a trial of CPAP is a good idea to help her feel more rested.  I will arrange home cpap.  Please continue to work on diet, exercise and weight loss as well. Follow up 6 weeks after starting CPAP.

## 2016-03-25 ENCOUNTER — Encounter: Payer: 59 | Attending: Family | Admitting: Dietician

## 2016-03-25 DIAGNOSIS — Z713 Dietary counseling and surveillance: Secondary | ICD-10-CM | POA: Diagnosis not present

## 2016-03-25 NOTE — Telephone Encounter (Signed)
Mychart message not read yet. Mailed letter.

## 2016-03-25 NOTE — Progress Notes (Signed)
  Medical Nutrition Therapy:  Appt start time: 1120 end time:  1205.   Assessment:  Primary concerns today: Angela Tucker is here today since she is working on losing weight. Since 8/9, has been doing water aerobics 2 x week, has been just drinking water, and cutting back on the sugar/bread she had been eating. Had a goal of losing 10 lbs this month and has lost about 7 lbs so far. Feels like she has more energy now than before. Iron was low and needs to get a new iron pill. Needs to get CPAP for OSA.   Works at [employer] and sits down all day. Her children's schedule can change each day. Lives with her husband and 3 children. Has one daughter who is a picky eater. States that both her and her husband do the meal preparation at home. Tries to not skip meals. Eats out about 2 x week.  Some days feels hungry throughout the day since making her changes. Will have more fruit or pop chips if she is hungry. Feels like she needs to work on cutting back on bread, exercise more, and maybe do some meal prep. Feels like her portion sizes could be better.    Preferred Learning Style:   No preference indicated   Learning Readiness:   Ready  MEDICATIONS: none   DIETARY INTAKE:  Usual eating pattern includes 3 meals and 2-3 snacks per day.  Avoided foods include: liver    24-hr recall:  B ( AM): English muffins with sausage, egg, and cheese with a hashed brown from [employer] or [employer]  Snk ( AM): banana or yogurt L ( PM): leftover dinner - hamburger helper, chicken and rice with broccoli Snk ( PM): pop chips  D ( PM): meat, hamburger with corn/green bean/peas, chicken and rice with broccoli Snk ( PM): none or 3-5 x week ice cream  Beverages: water  Usual physical activity: water aerobics 2 x week  Estimated energy needs: 1800 calories 200 g carbohydrates 135 g protein 50 g fat  Progress Towards Goal(s):  In progress.   Nutritional Diagnosis:  [place]-3.3 Overweight/obesity As related to hx of  large portion sizes .  As evidenced by BMI of 69.2.    Intervention:  Nutrition counseling provided. Plan: Continue to drink mostly water.  Look into getting a filter for water at home and [facility] brand) cup. For breakfast, try the Egg McMuffin with Canadian bacon and no hashed brown. At lunch and dinner, aim to fill half of your plate with vegetables, have lean protein the size of the palm of your hand, and starch/fruit the amount you can hold in your hand. Have protein with carbs for snacks (see list). Try limiting ice cream to once a week (think about going to get ice cream instead of having at home).  Looking into doing more exercise at home.   Teaching Method Utilized:  Visual Auditory Hands on  Handouts given during visit include:  MyPlate Handout  Meal Card  15 g CHO Snacks  Barriers to learning/adherence to lifestyle change: busy schedule with kids  Demonstrated degree of understanding via:  Teach Back   Monitoring/Evaluation:  Dietary intake, exercise, and body weight in 1 month(s).

## 2016-03-25 NOTE — Telephone Encounter (Signed)
Pt has not yet read below [employer]. Mailed letter to pt.

## 2016-03-25 NOTE — Patient Instructions (Signed)
Continue to drink mostly water.  Look into getting a filter for water at home and [facility] brand) cup. For breakfast, try the Egg McMuffin with Canadian bacon and no hashed brown. At lunch and dinner, aim to fill half of your plate with vegetables, have lean protein the size of the palm of your hand, and starch/fruit the amount you can hold in your hand. Have protein with carbs for snacks (see list). Try limiting ice cream to once a week (think about going to get ice cream instead of having at home).  Looking into doing more exercise at home.

## 2016-04-01 ENCOUNTER — Encounter: Payer: Self-pay | Admitting: Family

## 2016-04-01 ENCOUNTER — Ambulatory Visit (INDEPENDENT_AMBULATORY_CARE_PROVIDER_SITE_OTHER): Payer: 59 | Admitting: Family

## 2016-04-01 DIAGNOSIS — G4733 Obstructive sleep apnea (adult) (pediatric): Secondary | ICD-10-CM

## 2016-04-01 DIAGNOSIS — I10 Essential (primary) hypertension: Secondary | ICD-10-CM

## 2016-04-01 MED ORDER — NYSTATIN 100000 UNIT/GM EX POWD: Freq: Two times a day (BID) | CUTANEOUS | 1 refills | Status: DC

## 2016-04-01 NOTE — Assessment & Plan Note (Signed)
BP stable.  Monitor °

## 2016-04-01 NOTE — Progress Notes (Signed)
Subjective:    Patient ID: Angela Tucker, female    DOB: Jun 18, 1971, 45 y.o.   MRN: [id number]  HPI  Angela Tucker is a 45 yr old female who presents today for follow up.  1) OSA- has not been contacted by home healthy yet.    2) skin rash- reports darkening of skin beneath her breasts.    3) Morbid obesity- reports that she has been working on her diet but is not exercising. She met with the nutritionist.  Wt Readings from Last 3 Encounters:  04/01/16 (!) 402 lb 9.6 oz (182.6 kg)  03/25/16 (!) 403 lb 3.2 oz (182.9 kg)  02/19/16 (!) 405 lb 12.8 oz (184.1 kg)    Review of Systems See HPI  Past Medical History:  Diagnosis Date  . Anemia   . Hypertension   . OSA (obstructive sleep apnea)      Social History   Social History  . Marital status: Married    Spouse name: N/A  . Number of children: N/A  . Years of education: N/A   Occupational History  . Not on file.   Social History Main Topics  . Smoking status: Never Smoker  . Smokeless tobacco: Never Used  . Alcohol use 0.0 oz/week     Comment: occasional glass of wine  . Drug use: No  . Sexual activity: Not on file   Other Topics Concern  . Not on file   Social History Narrative   Works at [employer]   Married   3 children   Born 1997- Son [name]   2002- [name] daughter   2004- [name] son   Working on her masters       Past Surgical History:  Procedure Laterality Date  . CESAREAN SECTION     x 3    Family History  Problem Relation Age of Onset  . Cancer Mother 70    breast  . Hypertension Father   . Diabetes Father   . Diabetes Paternal Grandmother   . Hypertension Paternal Grandmother   . Diabetes Paternal Grandfather   . Hypertension Paternal Grandfather     Allergies  Allergen Reactions  . Atorvastatin Nausea Only  . Penicillins Hives    Current Outpatient Prescriptions on File Prior to Visit  Medication Sig Dispense Refill  . ferrous sulfate 325 (65 FE) MG tablet Take 325 mg by  mouth 2 (two) times daily with a meal.    . Multiple Vitamins-Minerals (MULTIVITAMIN WITH MINERALS) tablet Take 1 tablet by mouth daily.     No current facility-administered medications on file prior to visit.     BP 122/88 (BP Location: Left Arm) Comment (Cuff Size): thigh  Pulse 71   Temp 97.8 F (36.6 C) (Oral)   Resp 16   Ht 5' 5"  (1.651 m)   Wt (!) 402 lb 9.6 oz (182.6 kg)   LMP 03/25/2016   SpO2 99% Comment: room air  BMI 67.00 kg/m       Objective:   Physical Exam  Constitutional: She is oriented to person, place, and time. She appears well-developed and well-nourished.  HENT:  Head: Normocephalic and atraumatic.  Cardiovascular: Normal rate, regular rhythm and normal heart sounds.   No murmur heard. Pulmonary/Chest: Effort normal and breath sounds normal. No respiratory distress. She has no wheezes.  Neurological: She is alert and oriented to person, place, and time.  Skin: Skin is warm and dry.  Hyperpigmented rash beneath breasts.   Psychiatric: She has  a normal mood and affect. Her behavior is normal. Judgment and thought content normal.          Assessment & Plan:  Intertrigo- will rx with nystatin powder.

## 2016-04-01 NOTE — Assessment & Plan Note (Signed)
Will check status of CPAP delivery.

## 2016-04-01 NOTE — Assessment & Plan Note (Signed)
She has lost a few pounds since her last visit.  She is watching her diet and I have encouraged her to continue this. She is not exercising. We discussed adding walking 5 days a week with a goal of 30 minutes.

## 2016-04-01 NOTE — Patient Instructions (Addendum)
Please follow up in 1 month for blood draw. For rash beneath your breasts- keep area clean and dry and apply nystatin powder twice daily for the next 2 weeks then once daily. We will work on the CPAP set up for you.  Try to add 30 minutes of walking 5 days a week.

## 2016-04-28 ENCOUNTER — Encounter: Payer: 59 | Attending: Family | Admitting: Dietician

## 2016-04-28 DIAGNOSIS — Z713 Dietary counseling and surveillance: Secondary | ICD-10-CM | POA: Diagnosis not present

## 2016-04-28 NOTE — Progress Notes (Signed)
  Medical Nutrition Therapy:  Appt start time: [id number] end time: 1055    Assessment:  Primary concerns today: Angela Tucker is here today since she is working on losing weight. Returns with a 2 lb weight loss in 4 weeks. Feels like her energy level is lower in the past couple of weeks. Takes iron for a while and needs to get her blood check next week. Has not gotten vitamin D level checked before. Still hasn't gotten a CPAP.    Has not had time to do water aerobics this month. Has added more vegetables to dinner meal. Has been working on portion sizes. Feels like it has been helpful that she has been cooking breakfast at home.   Feels like she needs to work on planning meals with vegetables.    Preferred Learning Style:   No preference indicated   Learning Readiness:   Ready  MEDICATIONS: none   DIETARY INTAKE:  Usual eating pattern includes 3 meals and 2-3 snacks per day.  Avoided foods include: liver    24-hr recall:  B ( AM): cup of grits with bacon, and cheese or English muffins with sausage, egg, and cheese with a hashed brown from [employer] or [employer]  Snk ( AM): yogurt L ( PM): leftover dinner - hamburger helper, chicken and rice with broccoli Snk ( PM): nabs D ( PM): meat, hamburger with corn/green bean/peas, chicken and rice with broccoli (adding more vegetables) Snk ( PM): none lately or cookie Beverages: water  Usual physical activity: none  Estimated energy needs: 1800 calories 200 g carbohydrates 135 g protein 50 g fat  Progress Towards Goal(s):  In progress.   Nutritional Diagnosis:  [place]-3.3 Overweight/obesity As related to hx of large portion sizes .  As evidenced by BMI of 69.2.    Intervention:  Nutrition counseling provided. Plan: Look into getting a filter for water at home and [facility] brand) cup. For breakfast, try the Egg McMuffin with Canadian bacon. Try choosing one starch at breakfast (grits or biscuit/potatoes or muffin). Plan meals for  the week on Sundays. Plan to walk for 4 hours per week.  Ask doctor to do a vitamin D lab. Follow up about CPAP.   Teaching Method Utilized:  Visual Auditory Hands on  Handouts given during visit include:  none  Barriers to learning/adherence to lifestyle change: busy schedule with kids  Demonstrated degree of understanding via:  Teach Back   Monitoring/Evaluation:  Dietary intake, exercise, and body weight in 6 week(s).

## 2016-04-28 NOTE — Patient Instructions (Addendum)
Look into getting a filter for water at home and [facility] brand) cup. For breakfast, try the Egg McMuffin with Canadian bacon. Try choosing one starch at breakfast (grits or biscuit/potatoes or muffin). Plan meals for the week on Sundays. Plan to walk for 4 hours per week.  Ask doctor to do a vitamin D lab. Follow up about CPAP.

## 2016-05-04 ENCOUNTER — Other Ambulatory Visit (INDEPENDENT_AMBULATORY_CARE_PROVIDER_SITE_OTHER): Payer: 59

## 2016-05-04 ENCOUNTER — Encounter: Payer: Self-pay | Admitting: Family

## 2016-05-04 DIAGNOSIS — D649 Anemia, unspecified: Secondary | ICD-10-CM | POA: Diagnosis not present

## 2016-05-04 LAB — IRON: Iron: 30 ug/dL — ABNORMAL LOW (ref 42–145)

## 2016-05-04 LAB — CBC WITH DIFFERENTIAL/PLATELET
BASOS ABS: 0 10*3/uL (ref 0.0–0.1)
Basophils Relative: 0.5 % (ref 0.0–3.0)
EOS ABS: 0.2 10*3/uL (ref 0.0–0.7)
Eosinophils Relative: 2.4 % (ref 0.0–5.0)
HEMATOCRIT: 37 % (ref 36.0–46.0)
Hemoglobin: 11.8 g/dL — ABNORMAL LOW (ref 12.0–15.0)
Lymphocytes Relative: 40.7 % (ref 12.0–46.0)
Lymphs Abs: 3.5 10*3/uL (ref 0.7–4.0)
MCHC: 31.8 g/dL (ref 30.0–36.0)
MCV: 74.1 fl — ABNORMAL LOW (ref 78.0–100.0)
Monocytes Absolute: 0.3 10*3/uL (ref 0.1–1.0)
Monocytes Relative: 3.9 % (ref 3.0–12.0)
NEUTROS ABS: 4.5 10*3/uL (ref 1.4–7.7)
Neutrophils Relative %: 52.5 % (ref 43.0–77.0)
PLATELETS: 389 10*3/uL (ref 150.0–400.0)
RBC: 4.99 Mil/uL (ref 3.87–5.11)
RDW: 18.3 % — ABNORMAL HIGH (ref 11.5–15.5)
WBC: 8.6 10*3/uL (ref 4.0–10.5)

## 2016-05-04 LAB — FERRITIN: FERRITIN: 34.5 ng/mL (ref 10.0–291.0)

## 2016-05-18 NOTE — Telephone Encounter (Signed)
Please contact patient re: unread my chart message. 

## 2016-05-18 NOTE — Telephone Encounter (Signed)
Attempted to reach pt and left message to check mychart and respond back via mychart.

## 2016-06-09 ENCOUNTER — Ambulatory Visit: Payer: 59 | Admitting: Dietician

## 2016-08-28 ENCOUNTER — Ambulatory Visit (INDEPENDENT_AMBULATORY_CARE_PROVIDER_SITE_OTHER): Payer: 59 | Admitting: Family

## 2016-08-28 VITALS — BP 132/78 | HR 85 | Temp 98.1°F | Ht 64.0 in | Wt >= 6400 oz

## 2016-08-28 DIAGNOSIS — G43909 Migraine, unspecified, not intractable, without status migrainosus: Secondary | ICD-10-CM

## 2016-08-28 MED ORDER — SUMATRIPTAN SUCCINATE 50 MG PO TABS: ORAL_TABLET | ORAL | 0 refills | Status: DC

## 2016-08-28 NOTE — Progress Notes (Signed)
Pre visit review using our clinic review tool, if applicable. No additional management support is needed unless otherwise documented below in the visit note. 

## 2016-08-28 NOTE — Progress Notes (Signed)
Subjective:    Patient ID: Angela Tucker, female    DOB: 05/20/1971, 47 y.o.   MRN: [id number]  HPI  Angela Tucker is a 46 yr old female who presents today with chief complaint of headache. Headache has been present x 3 weeks and is associated with left leg pain. Reports not sleeping well due to headache.  Reports that HA is 8/10. Will decrease to 5/10 if she takes tylenol.  HA is frontal in nature.  Reports that HA is aching.  She denies associated nausea or sinus congestion or fever. Reports mild phonophobia/photophobia. She denies previous hx of similar HA. She reports bilateral knee pain.   Wt Readings from Last 3 Encounters:  08/28/16 (!) 403 lb 3.2 oz (182.9 kg)  04/28/16 (!) 401 lb 6.4 oz (182.1 kg)  04/01/16 (!) 402 lb 9.6 oz (182.6 kg)    Review of Systems    see HPI  Past Medical History:  Diagnosis Date  . Anemia   . Hypertension   . OSA (obstructive sleep apnea)      Social History   Social History  . Marital status: Married    Spouse name: N/A  . Number of children: N/A  . Years of education: N/A   Occupational History  . Not on file.   Social History Main Topics  . Smoking status: Never Smoker  . Smokeless tobacco: Never Used  . Alcohol use 0.0 oz/week     Comment: occasional glass of wine  . Drug use: No  . Sexual activity: Not on file   Other Topics Concern  . Not on file   Social History Narrative   Works at [employer]   Married   3 children   Born 1997- Son [name]   2002- [name] daughter   2004- [name] son   Working on her masters       Past Surgical History:  Procedure Laterality Date  . CESAREAN SECTION     x 3    Family History  Problem Relation Age of Onset  . Cancer Mother 57    breast  . Hypertension Father   . Diabetes Father   . Diabetes Paternal Grandmother   . Hypertension Paternal Grandmother   . Diabetes Paternal Grandfather   . Hypertension Paternal Grandfather     Allergies  Allergen Reactions  . Atorvastatin  Nausea Only  . Penicillins Hives    Current Outpatient Prescriptions on File Prior to Visit  Medication Sig Dispense Refill  . ferrous sulfate 325 (65 FE) MG tablet Take 325 mg by mouth 2 (two) times daily with a meal.    . Multiple Vitamins-Minerals (MULTIVITAMIN WITH MINERALS) tablet Take 1 tablet by mouth daily.    Marland Kitchen nystatin (MYCOSTATIN/NYSTOP) powder Apply topically 2 (two) times daily. (Patient not taking: Reported on 08/28/2016) 45 g 1   No current facility-administered medications on file prior to visit.     BP 132/78 (BP Location: Right Arm, Patient Position: Sitting) Comment (Cuff Size): thigh  Pulse 85   Temp 98.1 F (36.7 C)   Ht 5\' 4"  (1.626 m)   Wt (!) 403 lb 3.2 oz (182.9 kg)   SpO2 100%   BMI 69.21 kg/m    Objective:   Physical Exam  Constitutional: She is oriented to person, place, and time. She appears well-developed and well-nourished.  HENT:  Head: Normocephalic and atraumatic.  Right Ear: Tympanic membrane and ear canal normal.  Left Ear: Tympanic membrane and ear canal normal.  Eyes: EOM are normal. Pupils are equal, round, and reactive to light.  Cardiovascular: Normal rate, regular rhythm and normal heart sounds.   No murmur heard. Pulmonary/Chest: Effort normal and breath sounds normal. No respiratory distress. She has no wheezes.  Musculoskeletal: She exhibits no edema.  Neurological: She is alert and oriented to person, place, and time.  Bilateral UE/LE strength 5/5.   Skin: Skin is warm and dry.  Psychiatric: She has a normal mood and affect. Her behavior is normal. Judgment and thought content normal.          Assessment & Plan:  Migraine- rx with imitrex. Offered her a toradol injection today in the office but she declines.  Morbid obesity- we discussed referral to bariatric clinic. Discussed importance of diet/exercise weight loss on her overall healthy. This will also help with her knee pain significantly.

## 2016-08-28 NOTE — Patient Instructions (Signed)
Please begin imitrex as needed for headache. Call if headache worsens or fails to improve. You will be contacted about your referral to the bariatric clinic.

## 2016-11-17 ENCOUNTER — Telehealth: Payer: Self-pay | Admitting: Family

## 2016-11-17 ENCOUNTER — Emergency Department (HOSPITAL_COMMUNITY): Payer: 59

## 2016-11-17 ENCOUNTER — Emergency Department (HOSPITAL_BASED_OUTPATIENT_CLINIC_OR_DEPARTMENT_OTHER)
Admission: EM | Admit: 2016-11-17 | Discharge: 2016-11-18 | Disposition: A | Discharge status: Home / Self Care | Payer: 59 | Attending: Emergency Medicine | Admitting: Emergency Medicine

## 2016-11-17 ENCOUNTER — Emergency Department (HOSPITAL_BASED_OUTPATIENT_CLINIC_OR_DEPARTMENT_OTHER): Payer: 59

## 2016-11-17 ENCOUNTER — Encounter (HOSPITAL_BASED_OUTPATIENT_CLINIC_OR_DEPARTMENT_OTHER): Payer: Self-pay | Admitting: Emergency Medicine

## 2016-11-17 DIAGNOSIS — R0789 Other chest pain: Secondary | ICD-10-CM | POA: Insufficient documentation

## 2016-11-17 DIAGNOSIS — R42 Dizziness and giddiness: Secondary | ICD-10-CM

## 2016-11-17 DIAGNOSIS — R51 Headache: Secondary | ICD-10-CM | POA: Diagnosis not present

## 2016-11-17 DIAGNOSIS — Z8673 Personal history of transient ischemic attack (TIA), and cerebral infarction without residual deficits: Secondary | ICD-10-CM | POA: Insufficient documentation

## 2016-11-17 DIAGNOSIS — R112 Nausea with vomiting, unspecified: Secondary | ICD-10-CM | POA: Insufficient documentation

## 2016-11-17 DIAGNOSIS — I10 Essential (primary) hypertension: Secondary | ICD-10-CM | POA: Insufficient documentation

## 2016-11-17 LAB — COMPREHENSIVE METABOLIC PANEL
ALBUMIN: 3.6 g/dL (ref 3.5–5.0)
ALT: 12 U/L — ABNORMAL LOW (ref 14–54)
AST: 14 U/L — AB (ref 15–41)
Alkaline Phosphatase: 82 U/L (ref 38–126)
Anion gap: 6 (ref 5–15)
BUN: 8 mg/dL (ref 6–20)
CHLORIDE: 103 mmol/L (ref 101–111)
CO2: 29 mmol/L (ref 22–32)
Calcium: 8.7 mg/dL — ABNORMAL LOW (ref 8.9–10.3)
Creatinine, Ser: 0.67 mg/dL (ref 0.44–1.00)
GFR calc Af Amer: >60 mL/min (ref >60)
GFR calc non Af Amer: >60 mL/min (ref >60)
GLUCOSE: 89 mg/dL (ref 65–99)
POTASSIUM: 3.6 mmol/L (ref 3.5–5.1)
Sodium: 138 mmol/L (ref 135–145)
Total Bilirubin: 0.4 mg/dL (ref 0.3–1.2)
Total Protein: 7.2 g/dL (ref 6.5–8.1)

## 2016-11-17 LAB — CBC WITH DIFFERENTIAL/PLATELET
BASOS ABS: 0.1 10*3/uL (ref 0.0–0.1)
BASOS PCT: 0 %
EOS PCT: 3 %
Eosinophils Absolute: 0.3 10*3/uL (ref 0.0–0.7)
HEMATOCRIT: 35.6 % — AB (ref 36.0–46.0)
Hemoglobin: 11.4 g/dL — ABNORMAL LOW (ref 12.0–15.0)
Lymphocytes Relative: 33 %
Lymphs Abs: 3.8 10*3/uL (ref 0.7–4.0)
MCH: 24 pg — ABNORMAL LOW (ref 26.0–34.0)
MCHC: 32 g/dL (ref 30.0–36.0)
MCV: 74.9 fL — ABNORMAL LOW (ref 78.0–100.0)
MONO ABS: 1 10*3/uL (ref 0.1–1.0)
Monocytes Relative: 8 %
NEUTROS ABS: 6.4 10*3/uL (ref 1.7–7.7)
Neutrophils Relative %: 56 %
PLATELETS: 390 10*3/uL (ref 150–400)
RBC: 4.75 MIL/uL (ref 3.87–5.11)
RDW: 17.4 % — AB (ref 11.5–15.5)
WBC: 11.5 10*3/uL — ABNORMAL HIGH (ref 4.0–10.5)

## 2016-11-17 MED ORDER — SODIUM CHLORIDE 0.9 % IV BOLUS (SEPSIS)
500.0000 mL | Freq: Once | INTRAVENOUS | Status: AC
  Administered 2016-11-17: 500 mL via INTRAVENOUS

## 2016-11-17 MED ORDER — MECLIZINE HCL 25 MG PO TABS
25.0000 mg | ORAL_TABLET | Freq: Once | ORAL | Status: DC
  Filled 2016-11-17: qty 1

## 2016-11-17 MED ORDER — KETOROLAC TROMETHAMINE 30 MG/ML IJ SOLN
30.0000 mg | Freq: Once | INTRAMUSCULAR | Status: AC
  Administered 2016-11-17: 30 mg via INTRAVENOUS

## 2016-11-17 MED ORDER — ONDANSETRON HCL 4 MG/2ML IJ SOLN
4.0000 mg | Freq: Once | INTRAMUSCULAR | Status: AC
  Administered 2016-11-17: 4 mg via INTRAVENOUS
  Filled 2016-11-17: qty 2

## 2016-11-17 MED ORDER — KETOROLAC TROMETHAMINE 30 MG/ML IJ SOLN
30.0000 mg | Freq: Once | INTRAMUSCULAR | Status: DC
  Filled 2016-11-17: qty 1

## 2016-11-17 MED ORDER — MECLIZINE HCL 25 MG PO TABS: 25.0000 mg | ORAL_TABLET | Freq: Three times a day (TID) | ORAL | 0 refills | Status: DC | PRN

## 2016-11-17 NOTE — Telephone Encounter (Signed)
Patient states she is having dizzy spells and the room feels like it is spinning. Transferred to [facility]

## 2016-11-17 NOTE — ED Notes (Signed)
Patient ambulated in hallway.  Patient complained that the light was bothering her and causing dizziness.  Patient also stated that she still had a headache and wanted lights off.

## 2016-11-17 NOTE — Telephone Encounter (Signed)
[facility] - Client  TELEPHONE ADVICE RECORD   [facility]     Patient Name: [facility] - Client    Client Site [facility]    Physician Debbrah Alar - NP    Contact Type Call    Who Is Calling Patient / Member / Family / Caregiver    Call Type Triage / Clinical    Relationship To Patient Self    Return Phone Number [phone] (Primary)    Chief Complaint Dizziness  Gender: Female Reason for Call Symptomatic / Request for Health Information  DOB: 1971-02-04  Initial Comment Caller has been having dizzy spells.   Age: 46 Y 45 M 10 D Appointment Disposition EMR Appointment Not Necessary  Return Phone Number: [phone] (Primary) Info pasted into Epic Yes  Address:  PreDisposition Call a family member  City/State/Zip: [place]  Translation No    Nurse Assessment  Nurse: Venetia Maxon, RN, Manuela Schwartz Date/Time (Eastern Time): 11/17/2016 4:05:05 PM  Confirm and document reason for call. If symptomatic, describe symptoms. ---Caller has been having dizzy spells. she has had HX of this symptoms. She saw OB GYN ( annual visit ) and when she went to lay down she felt like the room was spinning. GYN advised her to call PCP. She has been drinking well. Last urine was recent. She has irregular periods. since tubal . spotting one last month 10/25/16   Does the patient have any new or worsening symptoms? ---Yes   Will a triage be completed? ---Yes   Related visit to physician within the last 2 weeks? ---No   Does the PT have any chronic conditions? (i.e. diabetes, asthma, etc.) ---No   Is the patient pregnant or possibly pregnant? (Ask all females between the ages of 50-55) ---No   Is this a behavioral health or substance abuse call? ---No     Guidelines      Guideline Title Affirmed Question Affirmed Notes Nurse Date/Time (Eastern Time)  Dizziness - Vertigo SEVERE dizziness (vertigo)  (e.g., unable to walk without assistance)  Venetia Maxon, RN, Manuela Schwartz 11/17/2016 4:07:36 PM  Disp. Time Eilene Ghazi Time) Disposition Final User         11/17/2016 4:09:38 PM Go to ED Now (or PCP triage) Yes Venetia Maxon, RN, Edwena Bunde Understands: Yes   Disagree/Comply: Comply      Care Advice Given Per Guideline         GO TO ED NOW (OR PCP TRIAGE): * Another adult should drive. NOTE TO TRIAGER - DRIVING: AMBULANCE TRANSPORT (NOTE TO TRIAGER): If the patient cannot walk at all because of severe dizziness, then the patient may need to be transported via ambulance. The patient or family members can arrange ambulance transport via private ambulance company or via EMS 911. CARE ADVICE given per Dizziness - Vertigo (Adult) guideline.             Referrals   [facility] - ED

## 2016-11-17 NOTE — ED Notes (Signed)
Informed pt. Provider wants her to ambulate to see how she is, pt. Asked if we could wait a little bit. Pt agreed to try to ambulate in 5-10 minutes

## 2016-11-17 NOTE — ED Notes (Signed)
Patient transported to CT 

## 2016-11-17 NOTE — ED Notes (Signed)
Pt returned from MRI °

## 2016-11-17 NOTE — Discharge Instructions (Signed)
Today your evaluated for acute onset of dizziness, your CT scan and MRI of your brain are normal.  You have been given a prescription for medication called and meclizine which treats vertigo or dizziness.  Please uses as needed up to 3 times a day.  Please make an appointment with your PCP for follow-up.  Return if you have symptoms that are not controlled with the medication or become worse

## 2016-11-17 NOTE — ED Notes (Signed)
ED Provider at bedside. 

## 2016-11-17 NOTE — ED Notes (Signed)
Pt connected to the monitor

## 2016-11-17 NOTE — ED Triage Notes (Signed)
Patient reports that she went to the [facility] GYN dr today and when she was laying down for her exam she started getting dizzy. Since then she has had a headache

## 2016-11-17 NOTE — ED Provider Notes (Signed)
[place] DEPT Provider Note   CSN: [id number] Arrival date & time: 11/17/16  1629     History   Chief Complaint Chief Complaint  Patient presents with  . Dizziness    HPI Angela Tucker is a 46 y.o. female.  This a morbidly obese African-American female who was transferred to [facility] of the brain. She states she was at her OB/GYN office.  For example, when she acutely became dizzy and was sent to the nearest facility for evaluation.  The exam at [facility] showed normal physical exam labs within normal parameters and a normal head CT.  Neurology was consultative.  He requested the MRI. On my questioning, patient states that she also developed a headache and pain in her left shoulder and left lower leg at the same time of the onset of dizziness which was not mentioned prior.      Past Medical History:  Diagnosis Date  . Anemia   . Hypertension   . OSA (obstructive sleep apnea)     Patient Active Problem List   Diagnosis Date Noted  . OSA (obstructive sleep apnea)   . Preventative health care 10/17/2014  . Borderline diabetes 09/10/2014  . Hyperlipidemia 09/10/2014  . Knee pain 09/10/2014  . Numbness 08/29/2014  . CTS (carpal tunnel syndrome) 08/29/2014  . TIA (transient ischemic attack) 08/28/2014  . Syncope 08/28/2014  . Essential hypertension 08/28/2014  . Morbid obesity ([place]) 08/28/2014    Past Surgical History:  Procedure Laterality Date  . CESAREAN SECTION     x 3    OB History    No data available       Home Medications    Prior to Admission medications   Medication Sig Start Date End Date Taking? Authorizing Provider  meclizine (ANTIVERT) 25 MG tablet Take 1 tablet (25 mg total) by mouth 3 (three) times daily as needed for dizziness. 11/17/16   Junius Creamer, NP  Multiple Vitamins-Minerals (MULTIVITAMIN WITH MINERALS) tablet Take 1 tablet by mouth daily. Patient not taking: Reported on  11/17/2016 09/10/14   Debbrah Alar, NP  nystatin (MYCOSTATIN/NYSTOP) powder Apply topically 2 (two) times daily. Patient not taking: Reported on 08/28/2016 04/01/16   Debbrah Alar, NP  SUMAtriptan (IMITREX) 50 MG tablet One tab at start of headache, may repeat in 2 hours if needed. Max 2 tabs/24 hours. Patient not taking: Reported on 11/17/2016 08/28/16   Debbrah Alar, NP    Family History Family History  Problem Relation Age of Onset  . Cancer Mother 74    breast  . Hypertension Father   . Diabetes Father   . Diabetes Paternal Grandmother   . Hypertension Paternal Grandmother   . Diabetes Paternal Grandfather   . Hypertension Paternal Grandfather     Social History Social History  Substance Use Topics  . Smoking status: Never Smoker  . Smokeless tobacco: Never Used  . Alcohol use 0.0 oz/week     Comment: occasional glass of wine     Allergies   Atorvastatin and Penicillins   Review of Systems Review of Systems  Constitutional: Negative for fever.  HENT: Negative for ear pain.   Respiratory: Negative for cough and shortness of breath.   Cardiovascular: Negative for chest pain and leg swelling.  Gastrointestinal: Negative for nausea.  Musculoskeletal: Negative for neck pain.  Neurological: Positive for dizziness and headaches. Negative for weakness.  All other systems reviewed and are negative.    Physical  Exam Updated Vital Signs BP 124/74   Pulse 73   Temp 98.8 F (37.1 C) (Oral)   Resp 19   Wt (!) 182.8 kg   LMP 10/25/2016   SpO2 98%   BMI 69.17 kg/m   Physical Exam  Constitutional: She appears well-developed and well-nourished.  HENT:  Head: Normocephalic.  Eyes: Pupils are equal, round, and reactive to light.  Cardiovascular: Normal rate.   Pulmonary/Chest: Effort normal. She exhibits no tenderness.  Abdominal: Soft.  Musculoskeletal: She exhibits no tenderness.       Legs: Neurological: She is alert.  Skin: Skin is warm.    Psychiatric: She has a normal mood and affect.  Nursing note and vitals reviewed.    ED Treatments / Results  Labs (all labs ordered are listed, but only abnormal results are displayed) Labs Reviewed  CBC WITH DIFFERENTIAL/PLATELET - Abnormal; Notable for the following:       Result Value   WBC 11.5 (*)    Hemoglobin 11.4 (*)    HCT 35.6 (*)    MCV 74.9 (*)    MCH 24.0 (*)    RDW 17.4 (*)    All other components within normal limits  COMPREHENSIVE METABOLIC PANEL - Abnormal; Notable for the following:    Calcium 8.7 (*)    AST 14 (*)    ALT 12 (*)    All other components within normal limits    EKG  EKG Interpretation  Date/Time:  Tuesday Nov 17 2016 16:50:18 EDT Ventricular Rate:  90 PR Interval:    QRS Duration: 93 QT Interval:  356 QTC Calculation: 436 R Axis:   37 Text Interpretation:  Sinus rhythm Probable left atrial enlargement No STEMI.  Confirmed by LONG MD, JOSHUA [phone]) on 11/17/2016 5:29:05 PM       Radiology Ct Head Wo Contrast  Result Date: 11/17/2016 CLINICAL DATA:  Dizziness and headache over the last day. EXAM: CT HEAD WITHOUT CONTRAST TECHNIQUE: Contiguous axial images were obtained from the base of the skull through the vertex without intravenous contrast. COMPARISON:  MRI 08/28/2014.  Head CT 08/28/2014 FINDINGS: Brain: No evidence of malformation, atrophy, old or acute small or large vessel infarction, mass lesion, hemorrhage, hydrocephalus or extra-axial collection. No evidence of pituitary lesion. Vascular: No vascular calcification.  No hyperdense vessels. Skull: Normal.  No fracture or focal bone lesion. Sinuses/Orbits: Visualized sinuses are clear. No fluid in the middle ears or mastoids. Visualized orbits are normal. Other: None significant IMPRESSION: Normal head CT Electronically Signed   By: Nelson Chimes M.D.   On: 11/17/2016 18:20   Mr Brain Wo Contrast  Result Date: 11/17/2016 CLINICAL DATA:  46 y/o F; dizziness, headache, and left-sided  weakness. EXAM: MRI HEAD WITHOUT CONTRAST TECHNIQUE: Multiplanar, multiecho pulse sequences of the brain and surrounding structures were obtained without intravenous contrast. COMPARISON:  11/17/2016 CT of head. FINDINGS: Brain: No acute infarction, hemorrhage, hydrocephalus, extra-axial collection or mass lesion. Vascular: Normal flow voids. Skull and upper cervical spine: Normal marrow signal. Sinuses/Orbits: Negative. Other: None. IMPRESSION: No acute intracranial abnormality identified. Unremarkable MRI of the brain for age. Electronically Signed   By: Kristine Garbe M.D.   On: 11/17/2016 22:44    Procedures Procedures (including critical care time)  Medications Ordered in ED Medications  meclizine (ANTIVERT) tablet 25 mg (25 mg Oral Not Given 11/17/16 1715)  sodium chloride 0.9 % bolus 500 mL (0 mLs Intravenous Stopped 11/17/16 1821)  ondansetron (ZOFRAN) injection 4 mg (4 mg Intravenous Given 11/17/16  1757)  ketorolac (TORADOL) 30 MG/ML injection 30 mg (30 mg Intravenous Given 11/17/16 2304)     Initial Impression / Assessment and Plan / ED Course  I have reviewed the triage vital signs and the nursing notes.  Pertinent labs & imaging results that were available during my care of the patient were reviewed by me and considered in my medical decision making (see chart for details).    MR reviewed old within normal parameters.  No pathology to explain patient's symptoms.  She's not currently having any vertigo or dizziness continues to have some discomfort in her left shoulder and left lower leg.  She'll be given IM Toradol and ambulated. Patient's pain is significantly decreased.  She's been ambulated in the hallway without return of her for Vertigo symptoms should be discharged him with a prescription for meclizine and instruction to follow-up with her PCP as needed   Final Clinical Impressions(s) / ED Diagnoses   Final diagnoses:  Vertigo  Non-intractable vomiting with nausea,  unspecified vomiting type  Chest wall discomfort    New Prescriptions New Prescriptions   MECLIZINE (ANTIVERT) 25 MG TABLET    Take 1 tablet (25 mg total) by mouth 3 (three) times daily as needed for dizziness.     Junius Creamer, NP 11/17/16 2242    Junius Creamer, NP 11/18/16 0000    Veryl Speak, MD 11/21/16 [phone]

## 2016-11-17 NOTE — ED Provider Notes (Signed)
Emergency Department Provider Note   I have reviewed the triage vital signs and the nursing notes.   HISTORY  Chief Complaint Dizziness   HPI Angela Tucker is a 45 y.o. female with PMH of HTN and OSA presents to the emergency department for evaluation of sudden onset vertigo symptoms with nausea and vomiting. Patient was at her OB/GYN for routine appointment when she is lying back in the exam table and became suddenly very uncomfortable. She describes lightheadedness with the room spinning sensation and nausea. Episode lasted for approximately 10 minutes and she was referred to the emergency department. Patient states she had a similar episode 2 days ago that lasted for 10-15 minutes but that was not brought on by movement. Patient sitting still she has few if any symptoms. No numbness or weakness in the arms or legs. No fevers or chills. No new medications. Patient denies any earache, decreased hearing, ear drainage.    Past Medical History:  Diagnosis Date  . Anemia   . Hypertension   . OSA (obstructive sleep apnea)     Patient Active Problem List   Diagnosis Date Noted  . OSA (obstructive sleep apnea)   . Preventative health care 10/17/2014  . Borderline diabetes 09/10/2014  . Hyperlipidemia 09/10/2014  . Knee pain 09/10/2014  . Numbness 08/29/2014  . CTS (carpal tunnel syndrome) 08/29/2014  . TIA (transient ischemic attack) 08/28/2014  . Syncope 08/28/2014  . Essential hypertension 08/28/2014  . Morbid obesity ([place]) 08/28/2014    Past Surgical History:  Procedure Laterality Date  . CESAREAN SECTION     x 3    Current Outpatient Rx  . Order #: [id number] Class: OTC  . Order #: [id number] Class: Normal  . Order #: [id number] Class: Normal    Allergies Atorvastatin and Penicillins  Family History  Problem Relation Age of Onset  . Cancer Mother 35    breast  . Hypertension Father   . Diabetes Father   . Diabetes Paternal Grandmother   . Hypertension  Paternal Grandmother   . Diabetes Paternal Grandfather   . Hypertension Paternal Grandfather     Social History Social History  Substance Use Topics  . Smoking status: Never Smoker  . Smokeless tobacco: Never Used  . Alcohol use 0.0 oz/week     Comment: occasional glass of wine    Review of Systems  Constitutional: No fever/chills Eyes: No visual changes. ENT: No sore throat.  Cardiovascular: Denies chest pain. Respiratory: Denies shortness of breath. Gastrointestinal: No abdominal pain.  No nausea, no vomiting.  No diarrhea.  No constipation. Genitourinary: Negative for dysuria. Musculoskeletal: Negative for back pain. Skin: Negative for rash. Neurological: Negative for headaches, focal weakness or numbness. Positive vertigo symptoms.   10-point ROS otherwise negative.  ____________________________________________   PHYSICAL EXAM:  VITAL SIGNS: ED Triage Vitals  Enc Vitals Group     BP 11/17/16 1633 (!) 156/107     Pulse Rate 11/17/16 1633 89     Resp 11/17/16 1633 18     Temp 11/17/16 1633 98.8 F (37.1 C)     Temp Source 11/17/16 1633 Oral     SpO2 11/17/16 1633 100 %     Weight 11/17/16 1634 (!) 403 lb (182.8 kg)     Pain Score 11/17/16 1633 6   Constitutional: Alert and oriented. Well appearing and in no acute distress. Eyes: Conjunctivae are normal. PERRL. EOMI. Head: Atraumatic. Nose: No congestion/rhinnorhea. Mouth/Throat: Mucous membranes are moist.  Oropharynx non-erythematous. Neck: No stridor.   Cardiovascular:  Normal rate, regular rhythm. Good peripheral circulation. Grossly normal heart sounds.   Respiratory: Normal respiratory effort.  No retractions. Lungs CTAB. Gastrointestinal: Soft and nontender. No distention.  Musculoskeletal: No lower extremity tenderness nor edema. No gross deformities of extremities. Neurologic:  Normal speech and language. No gross focal neurologic deficits are appreciated. No pronator drift. Hesitant with finger to  nose but completes this within normal limits. No CN deficits 2-12.  Skin:  Skin is warm, dry and intact. No rash noted.  ____________________________________________   LABS (all labs ordered are listed, but only abnormal results are displayed)  Labs Reviewed  CBC WITH DIFFERENTIAL/PLATELET - Abnormal; Notable for the following:       Result Value   WBC 11.5 (*)    Hemoglobin 11.4 (*)    HCT 35.6 (*)    MCV 74.9 (*)    MCH 24.0 (*)    RDW 17.4 (*)    All other components within normal limits  COMPREHENSIVE METABOLIC PANEL - Abnormal; Notable for the following:    Calcium 8.7 (*)    AST 14 (*)    ALT 12 (*)    All other components within normal limits   ____________________________________________  EKG   EKG Interpretation  Date/Time:  Tuesday Nov 17 2016 16:50:18 EDT Ventricular Rate:  90 PR Interval:    QRS Duration: 93 QT Interval:  356 QTC Calculation: 436 R Axis:   37 Text Interpretation:  Sinus rhythm Probable left atrial enlargement No STEMI.  Confirmed by Ronie Fleeger MD, Laiza Veenstra [phone]) on 11/17/2016 5:29:05 PM       ____________________________________________  RADIOLOGY  Ct Head Wo Contrast  Result Date: 11/17/2016 CLINICAL DATA:  Dizziness and headache over the last day. EXAM: CT HEAD WITHOUT CONTRAST TECHNIQUE: Contiguous axial images were obtained from the base of the skull through the vertex without intravenous contrast. COMPARISON:  MRI 08/28/2014.  Head CT 08/28/2014 FINDINGS: Brain: No evidence of malformation, atrophy, old or acute small or large vessel infarction, mass lesion, hemorrhage, hydrocephalus or extra-axial collection. No evidence of pituitary lesion. Vascular: No vascular calcification.  No hyperdense vessels. Skull: Normal.  No fracture or focal bone lesion. Sinuses/Orbits: Visualized sinuses are clear. No fluid in the middle ears or mastoids. Visualized orbits are normal. Other: None significant IMPRESSION: Normal head CT Electronically Signed   By:  Nelson Chimes M.D.   On: 11/17/2016 18:20   Mr Brain Wo Contrast  Result Date: 11/17/2016 CLINICAL DATA:  46 y/o F; dizziness, headache, and left-sided weakness. EXAM: MRI HEAD WITHOUT CONTRAST TECHNIQUE: Multiplanar, multiecho pulse sequences of the brain and surrounding structures were obtained without intravenous contrast. COMPARISON:  11/17/2016 CT of head. FINDINGS: Brain: No acute infarction, hemorrhage, hydrocephalus, extra-axial collection or mass lesion. Vascular: Normal flow voids. Skull and upper cervical spine: Normal marrow signal. Sinuses/Orbits: Negative. Other: None. IMPRESSION: No acute intracranial abnormality identified. Unremarkable MRI of the brain for age. Electronically Signed   By: Kristine Garbe M.D.   On: 11/17/2016 22:44    ____________________________________________   PROCEDURES  Procedure(s) performed:   Procedures  None ____________________________________________   INITIAL IMPRESSION / ASSESSMENT AND PLAN / ED COURSE  Pertinent labs & imaging results that were available during my care of the patient were reviewed by me and considered in my medical decision making (see chart for details).  Patient presents to the emergency department for evaluation of sudden onset vertigo with nausea and vomiting. My exam patient appears comfortable but with looking to the right she becomes acutely  symptomatic in begins having vomiting. No obvious nystagmus. No clear neurological deficit. Plan for symptomatic management and reassessment.  05:30 PM Patient with continued symptoms. Somewhat atypical for peripheral vertigo. Plan for CT scan of the head and IV medications as patient did not pass swallow screen due to coughing.   06:44 PM Symptoms improved slightly with zofran but remains symptomatic with some mild symptoms at rest.   06:59 PM Spoke with Neurology regarding the case. Agree with ED-ED transfer for MRI.  07:07 PM Spoke with Dr. Stark Jock who accepts the  patient in ED-ED transfer to [facility] for MRI.  ____________________________________________  FINAL CLINICAL IMPRESSION(S) / ED DIAGNOSES  Final diagnoses:  Vertigo  Non-intractable vomiting with nausea, unspecified vomiting type     MEDICATIONS GIVEN DURING THIS VISIT:  Medications  meclizine (ANTIVERT) tablet 25 mg (25 mg Oral Not Given 11/17/16 1715)  sodium chloride 0.9 % bolus 500 mL (0 mLs Intravenous Stopped 11/17/16 1821)  ondansetron (ZOFRAN) injection 4 mg (4 mg Intravenous Given 11/17/16 1757)  ketorolac (TORADOL) 30 MG/ML injection 30 mg (30 mg Intravenous Given 11/17/16 2304)     NEW OUTPATIENT MEDICATIONS STARTED DURING THIS VISIT:  None   Note:  This document was prepared using Dragon voice recognition software and may include unintentional dictation errors.  Nanda Quinton, MD Emergency Medicine   Cinthia Rodden, [facility], MD 11/17/16 [phone]

## 2016-11-17 NOTE — Telephone Encounter (Signed)
[facility] - Client  TELEPHONE ADVICE RECORD   [facility] Medical Call Center     Patient Name: [facility] - Client    Client Site [facility] - Day    Physician Debbrah Alar - NP    Contact Type Call    Who Is Calling Patient / Member / Family / Caregiver    Call Type Triage / Clinical    Relationship To Patient Self    Return Phone Number [phone] (Primary)    Chief Complaint Dizziness  Gender: Female Reason for Call Symptomatic / Request for Health Information  DOB: Sep 05, 1970  Initial Comment Caller has been having dizzy spells.   Age: 46 Y 35 M 10 D Appointment Disposition EMR Appointment Not Necessary  Return Phone Number: [phone] (Primary) Info pasted into Epic Yes  Address:  PreDisposition Call a family member  City/State/Zip: [place]  Translation No    Nurse Assessment  Nurse: Venetia Maxon, RN, Manuela Schwartz Date/Time (Eastern Time): 11/17/2016 4:05:05 PM  Confirm and document reason for call. If symptomatic, describe symptoms. ---Caller has been having dizzy spells. she has had HX of this symptoms. She saw OB GYN ( annual visit ) and when she went to lay down she felt like the room was spinning. GYN advised her to call PCP. She has been drinking well. Last urine was recent. She has irregular periods. since tubal . spotting one last month 10/25/16   Does the patient have any new or worsening symptoms? ---Yes   Will a triage be completed? ---Yes   Related visit to physician within the last 2 weeks? ---No   Does the PT have any chronic conditions? (i.e. diabetes, asthma, etc.) ---No   Is the patient pregnant or possibly pregnant? (Ask all females between the ages of 31-55) ---No   Is this a behavioral health or substance abuse call? ---No     Guidelines      Guideline Title Affirmed Question Affirmed Notes Nurse Date/Time (Eastern Time)  Dizziness - Vertigo SEVERE dizziness (vertigo)  (e.g., unable to walk without assistance)  Venetia Maxon, RN, Manuela Schwartz 11/17/2016 4:07:36 PM  Disp. Time Eilene Ghazi Time) Disposition Final User         11/17/2016 4:09:38 PM Go to ED Now (or PCP triage) Yes Venetia Maxon, RN, Edwena Bunde Understands: Yes   Disagree/Comply: Comply      Care Advice Given Per Guideline         GO TO ED NOW (OR PCP TRIAGE): * Another adult should drive. NOTE TO TRIAGER - DRIVING: AMBULANCE TRANSPORT (NOTE TO TRIAGER): If the patient cannot walk at all because of severe dizziness, then the patient may need to be transported via ambulance. The patient or family members can arrange ambulance transport via private ambulance company or via EMS 911. CARE ADVICE given per Dizziness - Vertigo (Adult) guideline.             Referrals   [facility] - ED

## 2016-11-18 ENCOUNTER — Telehealth: Payer: Self-pay | Admitting: Family

## 2016-11-18 NOTE — Telephone Encounter (Signed)
See mychart.  

## 2016-11-20 ENCOUNTER — Encounter: Payer: Self-pay | Admitting: Family

## 2016-11-20 ENCOUNTER — Ambulatory Visit (INDEPENDENT_AMBULATORY_CARE_PROVIDER_SITE_OTHER): Payer: 59 | Admitting: Family

## 2016-11-20 VITALS — BP 136/82 | HR 74 | Temp 98.0°F | Resp 18 | Ht 64.0 in | Wt 397.6 lb

## 2016-11-20 DIAGNOSIS — G43909 Migraine, unspecified, not intractable, without status migrainosus: Secondary | ICD-10-CM | POA: Diagnosis not present

## 2016-11-20 DIAGNOSIS — H8110 Benign paroxysmal vertigo, unspecified ear: Secondary | ICD-10-CM

## 2016-11-20 MED ORDER — SUMATRIPTAN SUCCINATE 50 MG PO TABS: ORAL_TABLET | ORAL | 5 refills | Status: DC

## 2016-11-20 MED ORDER — AMITRIPTYLINE HCL 25 MG PO TABS: 25.0000 mg | ORAL_TABLET | Freq: Every day | ORAL | 2 refills | Status: DC

## 2016-11-20 MED ORDER — MECLIZINE HCL 25 MG PO TABS: 25.0000 mg | ORAL_TABLET | Freq: Three times a day (TID) | ORAL | 0 refills | Status: DC | PRN

## 2016-11-20 NOTE — Progress Notes (Signed)
Subjective:    Patient ID: Angela Tucker, female    DOB: 15-Feb-1971, 46 y.o.   MRN: [id number]  HPI  [name] is a 46 yr old female who presents today for ER follow up. Reports that she has dizziness upon waking in the AM.  Has some intermittent symptoms during the afternoon.   Headaches- daily.  Relieved with aleve. Occasionally associated nausea.  Notes HA's are often accompanied by phono/photphobia.  Daily headaches have been happening  Review of Systems    see HPI  Past Medical History:  Diagnosis Date  . Anemia   . Hypertension   . OSA (obstructive sleep apnea)      Social History   Social History  . Marital status: Married    Spouse name: N/A  . Number of children: N/A  . Years of education: N/A   Occupational History  . Not on file.   Social History Main Topics  . Smoking status: Never Smoker  . Smokeless tobacco: Never Used  . Alcohol use 0.0 oz/week     Comment: occasional glass of wine  . Drug use: No  . Sexual activity: Not on file   Other Topics Concern  . Not on file   Social History Narrative   Works at [employer]   Married   3 children   Born 1997- Son Angela Tucker   2002- Angela Tucker daughter   2004- Angela Tucker son   Working on her masters       Past Surgical History:  Procedure Laterality Date  . CESAREAN SECTION     x 3    Family History  Problem Relation Age of Onset  . Cancer Mother 22       breast  . Hypertension Father   . Diabetes Father   . Diabetes Paternal Grandmother   . Hypertension Paternal Grandmother   . Diabetes Paternal Grandfather   . Hypertension Paternal Grandfather     Allergies  Allergen Reactions  . Atorvastatin Nausea Only  . Penicillins Hives    Current Outpatient Prescriptions on File Prior to Visit  Medication Sig Dispense Refill  . meclizine (ANTIVERT) 25 MG tablet Take 1 tablet (25 mg total) by mouth 3 (three) times daily as needed for dizziness. 30 tablet 0  . Multiple Vitamins-Minerals (MULTIVITAMIN  WITH MINERALS) tablet Take 1 tablet by mouth daily.     No current facility-administered medications on file prior to visit.     BP 136/82 (BP Location: Right Arm) Comment (Cuff Size): thigh cuff  Pulse 74   Temp 98 F (36.7 C) (Oral)   Resp 18   Ht 5\' 4"  (1.626 m)   Wt (!) 397 lb 9.6 oz (180.4 kg)   LMP 10/25/2016   SpO2 99% Comment: room air  BMI 68.25 kg/m     Objective:   Physical Exam  Constitutional: She is oriented to person, place, and time. She appears well-developed and well-nourished.  HENT:  Head: Normocephalic and atraumatic.  Right Ear: Tympanic membrane and ear canal normal.  Left Ear: Tympanic membrane and ear canal normal.  Mouth/Throat: No oropharyngeal exudate or posterior oropharyngeal edema.  Eyes: Conjunctivae and EOM are normal. Pupils are equal, round, and reactive to light.  Cardiovascular: Normal rate, regular rhythm and normal heart sounds.   No murmur heard. Pulmonary/Chest: Effort normal and breath sounds normal. No respiratory distress. She has no wheezes.  Neurological: She is alert and oriented to person, place, and time.  Psychiatric: She has a normal mood  and affect. Her behavior is normal. Judgment and thought content normal.          Assessment & Plan:  Migraines- uncontrolled, trial of elavil, continue prn imitrex, advised pt to limit aleve use.  BPV- Uncontrolled. Advised pt- continue prn meclizine.  Refer to vestibular rehab.

## 2016-11-20 NOTE — Patient Instructions (Signed)
Begin Elavil (Amitryptyline) At bedtime for migraine prevention. You may use imitrex as needed (max 2 tabs/24 hours). Try to limit your use of aleve. Continue meclizine as needed for dizziness. We will work on getting you in with physical therapy for vestibular rehab.

## 2016-11-24 NOTE — Addendum Note (Signed)
Addended by: Debbrah Alar on: 11/24/2016 07:25 AM   Modules accepted: Orders

## 2016-12-03 ENCOUNTER — Ambulatory Visit: Payer: 59 | Attending: Family | Admitting: Rehabilitation

## 2016-12-03 ENCOUNTER — Encounter: Payer: Self-pay | Admitting: Rehabilitation

## 2016-12-03 DIAGNOSIS — M436 Torticollis: Secondary | ICD-10-CM | POA: Diagnosis present

## 2016-12-03 DIAGNOSIS — R42 Dizziness and giddiness: Secondary | ICD-10-CM

## 2016-12-03 NOTE — Therapy (Signed)
[facility] [address]  [place], [place], [address] Phone: [phone]   Fax:  [phone]  Physical Therapy Evaluation  Patient Details  Name: Angela Tucker MRN: [id number] Date of Birth: 03/14/1971 No Data Recorded  Encounter Date: 12/03/2016      PT End of Session - 12/03/16 1143    Visit Number 1   Date for PT Re-Evaluation 12/31/16   PT Start Time 0845   PT Stop Time 0928   PT Time Calculation (min) 43 min   Activity Tolerance Patient tolerated treatment well      Past Medical History:  Diagnosis Date  . Anemia   . Hypertension   . OSA (obstructive sleep apnea)     Past Surgical History:  Procedure Laterality Date  . CESAREAN SECTION     x 3    There were no vitals filed for this visit.       Subjective Assessment - 12/03/16 0846    Subjective pt presents with symptoms of vertigo x 2 weeks.  Was at a wedding and the room started spinning and the L shoulder and knee starting hurting.  All testing normal for stroke, etc.  Had another incident lying down at the MD office 1 week ago. The room was spinning again lasting about 68min. Reports normal BP. Went to the Er from the MD.  Given meclazine.  had another episode on Sunday where the room was spinning again.      Pertinent History Headaches   Patient Stated Goals decrease dizziness   Currently in Pain? No/denies            [facility] PT Assessment - 12/03/16 0001      ROM / Strength   AROM / PROM / Strength AROM     AROM   Overall AROM Comments Cervical AROM limited into SB and extension with increased HA pain into extension.  Reports neck always feels stiff            Vestibular Assessment - 12/03/16 0001      Symptom Behavior   Type of Dizziness Lightheadedness   Frequency of Dizziness 3 episodes   Duration of Dizziness 31minutes   Aggravating Factors Mornings;Lying supine   Relieving Factors Rest;Slow movements     Occulomotor Exam   Occulomotor Alignment Normal   Spontaneous Absent   Gaze-induced Absent   Smooth Pursuits Intact  gives HA looking up   Saccades Intact     Vestibulo-Occular Reflex   VOR 1 Head Only (x 1 viewing) increased dizziness lasting 10seconds   VOR to Slow Head Movement Normal   VOR Cancellation Normal   Comment neg head thrust R/L     Positional Testing   Dix-Hallpike Dix-Hallpike Right;Dix-Hallpike Left   Horizontal Canal Testing Horizontal Canal Right;Horizontal Canal Left     Dix-Hallpike Right   Dix-Hallpike Right Duration Neg   Dix-Hallpike Right Symptoms --  feels funny"     Dix-Hallpike Left   Dix-Hallpike Left Duration Neg "feels funny"     Horizontal Canal Right   Horizontal Canal Right Duration Neg     Horizontal Canal Left   Horizontal Canal Left Duration Neg     Cognition   Cognition Orientation Level Oriented x 4     Positional Sensitivities   Sit to Supine Lightheadedness   Supine to Left Side Lightheadedness   Supine to Right Side Lightheadedness   Right Hallpike Lightheadedness   Up from Right Hallpike Lightheadedness  Up from Left Hallpike Lightheadedness                       PT Education - 12/03/16 1142    Education provided Yes   Education Details HEP, diagnosis, anatomy   Person(s) Educated Patient   Methods Explanation;Demonstration;Handout   Comprehension Verbalized understanding;Returned demonstration;Tactile cues required;Verbal cues required             PT Long Term Goals - 12/03/16 1149      PT LONG TERM GOAL #1   Title pt will experience no dizziness x 1 week   Time 4   Period Weeks   Status New     PT LONG TERM GOAL #2   Title pt will experience no morning dizziness   Time 4   Period Weeks   Status New     PT LONG TERM GOAL #3   Title pt will improve cervical extension to painfree   Time 4   Period Weeks   Status New               Plan - 12/03/16 1143    Clinical  Impression Statement Pt presents with complaints of dizziness x 2 weeks.  pt has had 3 episodes of "spinning" lasting 5-10 minutes; two when sitting still and one when going from sit to supine.  Pt also has a history of frontal headaches and neck stiffness with objective findings of decrease cervical ROM and pain into extension.  All vestibular testing today negative for BPPV but pt has taken meclazine before coming today.  Will not take this for next visit to recheck [place] again.  All positional changes and VOR testing resulting in "dizziness and feeling funny" today.  Will recheck for BPPV next visit and change POC appropriately but will treat as motion sensitivity with cervicogenic component.     Rehab Potential Fair   PT Frequency 2x / week   PT Duration 4 weeks   PT Treatment/Interventions Canalith Repostioning;Therapeutic exercise;Neuromuscular re-education   PT Next Visit Plan recheck [facility], continue neck ROM/exercises and VOR / position training for dizziness   Consulted and Agree with Plan of Care Patient      Patient will benefit from skilled therapeutic intervention in order to improve the following deficits and impairments:  Dizziness  Visit Diagnosis: Dizziness and giddiness - Plan: PT plan of care cert/re-cert  Neck stiffness - Plan: PT plan of care cert/re-cert     Problem List Patient Active Problem List   Diagnosis Date Noted  . OSA (obstructive sleep apnea)   . Preventative health care 10/17/2014  . Borderline diabetes 09/10/2014  . Hyperlipidemia 09/10/2014  . Knee pain 09/10/2014  . Numbness 08/29/2014  . CTS (carpal tunnel syndrome) 08/29/2014  . TIA (transient ischemic attack) 08/28/2014  . Syncope 08/28/2014  . Essential hypertension 08/28/2014  . Morbid obesity ([place]) 08/28/2014    Stark Bray, DPT, CMP 12/03/2016, 11:52 AM  [facility] [address]  [place], [place], [address] Phone:  [phone]   Fax:  [phone]  Name: Angela Tucker MRN: [id number] Date of Birth: 10-11-1970

## 2016-12-09 ENCOUNTER — Ambulatory Visit: Payer: 59 | Admitting: Physical Therapy

## 2016-12-09 DIAGNOSIS — R42 Dizziness and giddiness: Secondary | ICD-10-CM | POA: Diagnosis not present

## 2016-12-09 DIAGNOSIS — M436 Torticollis: Secondary | ICD-10-CM

## 2016-12-09 NOTE — Therapy (Addendum)
[facility] [address]  [place], [place], [address] Phone: [phone]   Fax:  [phone]  Physical Therapy Treatment  Patient Details  Name: Angela Tucker MRN: [id number] Date of Birth: 1970-12-06 No Data Recorded  Encounter Date: 12/09/2016      PT End of Session - 12/09/16 0929    Visit Number 2   Date for PT Re-Evaluation 12/31/16   PT Start Time 0850   PT Stop Time 0918   PT Time Calculation (min) 28 min   Activity Tolerance Patient tolerated treatment well   Behavior During Therapy [facility] for tasks assessed/performed      Past Medical History:  Diagnosis Date  . Anemia   . Hypertension   . OSA (obstructive sleep apnea)     Past Surgical History:  Procedure Laterality Date  . CESAREAN SECTION     x 3    There were no vitals filed for this visit.      Subjective Assessment - 12/09/16 0852    Subjective Has been drinking chamomile tea - has been feeling a lot better, Did not take meclazine last night - small headache. Has not had symptoms of vertigo   Patient Stated Goals decrease dizziness   Currently in Pain? Yes   Pain Score 5    Pain Location Head   Pain Descriptors / Indicators Headache                Vestibular Assessment - 12/09/16 0001      Positional Testing   Dix-Hallpike Dix-Hallpike Right;Dix-Hallpike Left     Dix-Hallpike Right   Dix-Hallpike Right Duration Neg     Dix-Hallpike Left   Dix-Hallpike Left Duration Neg     Positional Sensitivities   Sit to Supine No dizziness   Supine to Left Side No dizziness   Supine to Right Side No dizziness   Supine to Sitting No dizziness   Right Hallpike No dizziness   Up from Right Hallpike No dizziness   Up from Left Hallpike No dizziness                 [facility] Adult PT Treatment/Exercise - 12/09/16 0001      Exercises   Exercises Neck     Neck Exercises: Seated   Neck Retraction 15 reps;5 secs   Other Seated Exercise scap squeeze - 15 x 5 sec     Neck Exercises: Stretches   Upper Trapezius Stretch 3 reps;30 seconds   Upper Trapezius Stretch Limitations L only   Levator Stretch 3 reps;20 seconds   Levator Stretch Limitations L only   Corner Stretch 3 reps;30 seconds   Corner Stretch Limitations doorway - low, mid, high                     PT Long Term Goals - 12/09/16 8546      PT LONG TERM GOAL #1   Title pt will experience no dizziness x 1 week   Status Partially Met     PT LONG TERM GOAL #2   Title pt will experience no morning dizziness   Status Partially Met     PT LONG TERM GOAL #3   Title pt will improve cervical extension to painfree   Status On-going               Plan - 12/09/16 0929    Clinical Impression Statement Patient returning to PT follwoing initial eval for dizziness/vertigo.  Patient with negative testing with B Dix-Hallpike testing, as well as some postional changes. Patient today with no complaints of dizziness, even without taking prescribed medication, however, does report headache that she has not taken anything for due to reposted fasting for church. Review of VOR exercises today with additional of vertical VOR with good carryover. Addition of cervical stretching and postural re-education exercises today for reported neck stiffness with patient tolerable to all activities.    PT Treatment/Interventions Canalith Repostioning;Therapeutic exercise;Neuromuscular re-education   PT Next Visit Plan Cervical ROM/Exercises if needed   Consulted and Agree with Plan of Care Patient      Patient will benefit from skilled therapeutic intervention in order to improve the following deficits and impairments:  Dizziness  Visit Diagnosis: Dizziness and giddiness  Neck stiffness     Problem List Patient Active Problem List   Diagnosis Date Noted  . OSA (obstructive sleep apnea)   . Preventative health care 10/17/2014  . Borderline  diabetes 09/10/2014  . Hyperlipidemia 09/10/2014  . Knee pain 09/10/2014  . Numbness 08/29/2014  . CTS (carpal tunnel syndrome) 08/29/2014  . TIA (transient ischemic attack) 08/28/2014  . Syncope 08/28/2014  . Essential hypertension 08/28/2014  . Morbid obesity ([place]) 08/28/2014     Lanney Gins, PT, DPT 12/09/16 10:13 AM  PHYSICAL THERAPY DISCHARGE SUMMARY  Visits from Start of Care: 2  Current functional level related to goals / functional outcomes: See above   Remaining deficits: See abive   Education / Equipment: HEP  Plan: Patient agrees to discharge.  Patient goals were not met. Patient is being discharged due to meeting the stated rehab goals.  ?????     Lanney Gins, PT, DPT 02/01/17 9:58 AM   [facility] [address]  [place], [place], [address] Phone: [phone]   Fax:  [phone]  Name: Angela Tucker MRN: [id number] Date of Birth: February 14, 1971

## 2016-12-09 NOTE — Patient Instructions (Signed)
NECK TENSION: Assisted Stretch    Reach right arm around head and hold slightly above ear. Gently bring right ear toward right shoulder. Hold position for _30__ breaths. Repeat with other arm. Repeat __3_ times, alternating arms. Do _2-3__ times per day.   Levator Scapula Stretch, Sitting    Sit, one hand tucked under hip on side to be stretched, other hand over top of head. Turn head toward other side and look down. Use hand on head to gently stretch neck in that position. Hold __30_ seconds. Repeat _3__ times per session. Do _2-3__ sessions per day.   Axial Extension (Chin Tuck)    Pull chin in and lengthen back of neck. Hold _5___ seconds while counting out loud. Repeat _15__ times. Do _2-3___ sessions per day.   Scapular Retraction (Standing)    With arms at sides, pinch shoulder blades together. Repeat __15__ times per set. Do __2__ sets per session.    CHEST: Doorway, Bilateral - Standing    Standing in doorway, place hands on wall with elbows bent at shoulder height. Lean forward. Hold _30__ seconds. _3__ reps per set Step forward with one foot and lean forward. **low, middle, high**

## 2016-12-16 ENCOUNTER — Ambulatory Visit (INDEPENDENT_AMBULATORY_CARE_PROVIDER_SITE_OTHER): Payer: 59 | Admitting: Family

## 2016-12-16 ENCOUNTER — Encounter: Payer: Self-pay | Admitting: Family

## 2016-12-16 VITALS — BP 138/92 | HR 75 | Temp 98.4°F | Resp 16 | Ht 64.0 in | Wt >= 6400 oz

## 2016-12-16 DIAGNOSIS — I10 Essential (primary) hypertension: Secondary | ICD-10-CM | POA: Diagnosis not present

## 2016-12-16 DIAGNOSIS — G43909 Migraine, unspecified, not intractable, without status migrainosus: Secondary | ICD-10-CM | POA: Diagnosis not present

## 2016-12-16 MED ORDER — METOPROLOL SUCCINATE ER 25 MG PO TB24: 25.0000 mg | ORAL_TABLET | Freq: Every day | ORAL | 3 refills | Status: DC

## 2016-12-16 NOTE — Progress Notes (Signed)
Subjective:    Patient ID: Angela Tucker, female    DOB: Oct 29, 1970, 46 y.o.   MRN: [id number]  HPI  Angela Tucker is a 46 yr old female who presents today for follow up.  Migraine- last visit migraines were uncontrolled and she was started on trial of elavil. Current HA frequency is about 3 times a week. Previously was having daily headaches. Reports that when she has had headaches recently the severity is 5/10.  Takes imitrex and the headache resolves.    BPV- last visit a referral was made to vestibular rehab. She reports that she is doing vestibular rehab.  Reports resolution of dizziness.      BP Readings from Last 3 Encounters:  12/16/16 (!) 130/94  11/20/16 136/82  11/18/16 (!) 168/93     Review of Systems See HPI  Past Medical History:  Diagnosis Date  . Anemia   . Hypertension   . OSA (obstructive sleep apnea)      Social History   Social History  . Marital status: Married    Spouse name: N/A  . Number of children: N/A  . Years of education: N/A   Occupational History  . Not on file.   Social History Main Topics  . Smoking status: Never Smoker  . Smokeless tobacco: Never Used  . Alcohol use 0.0 oz/week     Comment: occasional glass of wine  . Drug use: No  . Sexual activity: Not on file   Other Topics Concern  . Not on file   Social History Narrative   Works at [employer]   Married   3 children   Born 1997- Son [name]   2002- [name] daughter   2004- [name] son   Working on her masters       Past Surgical History:  Procedure Laterality Date  . CESAREAN SECTION     x 3    Family History  Problem Relation Age of Onset  . Cancer Mother 33       breast  . Hypertension Father   . Diabetes Father   . Diabetes Paternal Grandmother   . Hypertension Paternal Grandmother   . Diabetes Paternal Grandfather   . Hypertension Paternal Grandfather     Allergies  Allergen Reactions  . Atorvastatin Nausea Only  . Penicillins Hives    Current  Outpatient Prescriptions on File Prior to Visit  Medication Sig Dispense Refill  . amitriptyline (ELAVIL) 25 MG tablet Take 1 tablet (25 mg total) by mouth at bedtime. 30 tablet 2  . meclizine (ANTIVERT) 25 MG tablet Take 1 tablet (25 mg total) by mouth 3 (three) times daily as needed for dizziness. 30 tablet 0  . Multiple Vitamins-Minerals (MULTIVITAMIN WITH MINERALS) tablet Take 1 tablet by mouth daily.    . SUMAtriptan (IMITREX) 50 MG tablet One tab at start of headache, may repeat in 2 hours if needed. Max 2 tabs/24 hours. 10 tablet 5   No current facility-administered medications on file prior to visit.     BP (!) 130/94 (BP Location: Right Arm) Comment (Cuff Size): thigh cuff  Pulse 75   Temp 98.4 F (36.9 C) (Oral)   Resp 16   Ht 5\' 4"  (1.626 m)   Wt (!) 403 lb 3.2 oz (182.9 kg)   LMP 10/25/2016   SpO2 99%   BMI 69.21 kg/m       Objective:   Physical Exam  Constitutional: She is oriented to person, place, and time. She appears  well-developed and well-nourished.  HENT:  Head: Normocephalic and atraumatic.  Cardiovascular: Normal rate, regular rhythm and normal heart sounds.   No murmur heard. Pulmonary/Chest: Effort normal and breath sounds normal. No respiratory distress. She has no wheezes.  Musculoskeletal: She exhibits no edema.  Neurological: She is alert and oriented to person, place, and time.  Skin: Skin is warm and dry.  Psychiatric: She has a normal mood and affect. Her behavior is normal. Judgment and thought content normal.          Assessment & Plan:  Hypertension- BP is mildly elevated. Will add low dose beta blocker for BP control and HA prevention.  Migraines- improving, but still having 3x a week. Continue elavil, add trial of beta blocker.

## 2016-12-16 NOTE — Patient Instructions (Signed)
Begin toprol xl once daily. This will help with your blood pressure and your headaches.

## 2017-01-14 ENCOUNTER — Ambulatory Visit (INDEPENDENT_AMBULATORY_CARE_PROVIDER_SITE_OTHER): Payer: 59 | Admitting: Family

## 2017-01-14 ENCOUNTER — Encounter: Payer: Self-pay | Admitting: Family

## 2017-01-14 VITALS — BP 124/90 | HR 77 | Temp 98.1°F | Resp 16 | Ht 64.0 in | Wt >= 6400 oz

## 2017-01-14 DIAGNOSIS — I10 Essential (primary) hypertension: Secondary | ICD-10-CM | POA: Diagnosis not present

## 2017-01-14 DIAGNOSIS — G43909 Migraine, unspecified, not intractable, without status migrainosus: Secondary | ICD-10-CM | POA: Diagnosis not present

## 2017-01-14 MED ORDER — METOPROLOL SUCCINATE ER 50 MG PO TB24: 50.0000 mg | ORAL_TABLET | Freq: Every day | ORAL | 2 refills | Status: DC

## 2017-01-14 NOTE — Patient Instructions (Signed)
Please increase metoprolol from 25mg  to 50mg  once daily.

## 2017-01-14 NOTE — Progress Notes (Signed)
Subjective:    Patient ID: Angela Tucker, female    DOB: Jan 22, 1971, 46 y.o.   MRN: [id number]  HPI  Angela Tucker is  46 yr old female who presents today for follow up.  1) HTN- last visit we added toprol xl for BP and to help with her migraines.  BP Readings from Last 3 Encounters:  01/14/17 124/90  12/16/16 (!) 138/92  11/20/16 136/82   2) Migraine- prior to starting a beta blocker she reported headaches 3x a week.  She reports only 1-2 headaches a week now. She reports that they are just "headaches" and not migraines.    Review of Systems Past Medical History:  Diagnosis Date  . Anemia   . Hypertension   . OSA (obstructive sleep apnea)      Social History   Social History  . Marital status: Married    Spouse name: N/A  . Number of children: N/A  . Years of education: N/A   Occupational History  . Not on file.   Social History Main Topics  . Smoking status: Never Smoker  . Smokeless tobacco: Never Used  . Alcohol use 0.0 oz/week     Comment: occasional glass of wine  . Drug use: No  . Sexual activity: Not on file   Other Topics Concern  . Not on file   Social History Narrative   Works at [employer]   Married   3 children   Born 1997- Son Angela Tucker   2002- Angela Tucker daughter   2004- Angela Tucker son   Working on her masters       Past Surgical History:  Procedure Laterality Date  . CESAREAN SECTION     x 3    Family History  Problem Relation Age of Onset  . Cancer Mother 56       breast  . Hypertension Father   . Diabetes Father   . Diabetes Paternal Grandmother   . Hypertension Paternal Grandmother   . Diabetes Paternal Grandfather   . Hypertension Paternal Grandfather     Allergies  Allergen Reactions  . Atorvastatin Nausea Only  . Penicillins Hives    Current Outpatient Prescriptions on File Prior to Visit  Medication Sig Dispense Refill  . meclizine (ANTIVERT) 25 MG tablet Take 1 tablet (25 mg total) by mouth 3 (three) times daily as needed  for dizziness. 30 tablet 0  . metoprolol succinate (TOPROL-XL) 25 MG 24 hr tablet Take 1 tablet (25 mg total) by mouth daily. 30 tablet 3  . Multiple Vitamins-Minerals (MULTIVITAMIN WITH MINERALS) tablet Take 1 tablet by mouth daily.    . SUMAtriptan (IMITREX) 50 MG tablet One tab at start of headache, may repeat in 2 hours if needed. Max 2 tabs/24 hours. 10 tablet 5  . amitriptyline (ELAVIL) 25 MG tablet Take 1 tablet (25 mg total) by mouth at bedtime. (Patient not taking: Reported on 01/14/2017) 30 tablet 2   No current facility-administered medications on file prior to visit.     BP 124/90 (BP Location: Right Arm) Comment (Cuff Size): thigh  Pulse 77   Temp 98.1 F (36.7 C) (Oral)   Resp 16   Ht 5\' 4"  (1.626 m)   Wt (!) 402 lb (182.3 kg)   LMP 12/25/2016   SpO2 98%   BMI 69.00 kg/m       Objective:   Physical Exam  Constitutional: She appears well-developed and well-nourished.  Cardiovascular: Normal rate, regular rhythm and normal heart sounds.  No murmur heard. Pulmonary/Chest: Effort normal and breath sounds normal. No respiratory distress. She has no wheezes.  Psychiatric: She has a normal mood and affect. Her behavior is normal. Judgment and thought content normal.          Assessment & Plan:  HTN- SBP improved.  I would really like to see her DBP <90.  Will increase toprol xl from 25mg  to 50mg .   Migraines- improved.  Continue beta blocker for migraine prophylaxis.

## 2017-02-15 ENCOUNTER — Ambulatory Visit: Payer: 59 | Admitting: Family

## 2017-09-24 ENCOUNTER — Encounter (HOSPITAL_BASED_OUTPATIENT_CLINIC_OR_DEPARTMENT_OTHER): Payer: Self-pay | Admitting: *Deleted

## 2017-09-24 ENCOUNTER — Emergency Department (HOSPITAL_BASED_OUTPATIENT_CLINIC_OR_DEPARTMENT_OTHER): Payer: 59

## 2017-09-24 ENCOUNTER — Emergency Department (HOSPITAL_BASED_OUTPATIENT_CLINIC_OR_DEPARTMENT_OTHER)
Admission: EM | Admit: 2017-09-24 | Discharge: 2017-09-24 | Disposition: A | Discharge status: Home / Self Care | Payer: 59 | Attending: Emergency Medicine | Admitting: Emergency Medicine

## 2017-09-24 ENCOUNTER — Other Ambulatory Visit: Payer: Self-pay

## 2017-09-24 DIAGNOSIS — I10 Essential (primary) hypertension: Secondary | ICD-10-CM | POA: Insufficient documentation

## 2017-09-24 DIAGNOSIS — Z79899 Other long term (current) drug therapy: Secondary | ICD-10-CM | POA: Insufficient documentation

## 2017-09-24 DIAGNOSIS — R05 Cough: Secondary | ICD-10-CM | POA: Diagnosis present

## 2017-09-24 DIAGNOSIS — J111 Influenza due to unidentified influenza virus with other respiratory manifestations: Secondary | ICD-10-CM | POA: Diagnosis not present

## 2017-09-24 DIAGNOSIS — R69 Illness, unspecified: Secondary | ICD-10-CM

## 2017-09-24 MED ORDER — NAPROXEN 250 MG PO TABS
500.0000 mg | ORAL_TABLET | Freq: Once | ORAL | Status: AC
  Administered 2017-09-24: 500 mg via ORAL
  Filled 2017-09-24: qty 2

## 2017-09-24 MED ORDER — BENZONATATE 100 MG PO CAPS: 100.0000 mg | ORAL_CAPSULE | Freq: Three times a day (TID) | ORAL | 0 refills | Status: DC | PRN

## 2017-09-24 MED ORDER — LORATADINE 10 MG PO TABS
10.0000 mg | ORAL_TABLET | Freq: Once | ORAL | Status: AC
  Administered 2017-09-24: 10 mg via ORAL
  Filled 2017-09-24: qty 1

## 2017-09-24 MED ORDER — NAPROXEN 500 MG PO TABS: 500.0000 mg | ORAL_TABLET | Freq: Two times a day (BID) | ORAL | 0 refills | Status: DC

## 2017-09-24 MED ORDER — BENZONATATE 100 MG PO CAPS
200.0000 mg | ORAL_CAPSULE | Freq: Once | ORAL | Status: AC
  Administered 2017-09-24: 200 mg via ORAL
  Filled 2017-09-24: qty 2

## 2017-09-24 NOTE — ED Triage Notes (Signed)
Pt began to have body aches and congestion yesterday. Feels as if she is not getting any better.

## 2017-09-24 NOTE — ED Provider Notes (Signed)
[place] EMERGENCY DEPARTMENT Provider Note   CSN: [id number] Arrival date & time: 09/24/17  1226     History   Chief Complaint Chief Complaint  Patient presents with  . Nasal Congestion  . Generalized Body Aches    HPI Angela Tucker is a 47 y.o. female.   47 year old female presents to the emergency department for evaluation of flulike symptoms.  She reports onset of body aches a few days ago with cough and congestion.  Cough has slightly improved over the past 24 hours.  She has noted increased shortness of breath which is aggravated by exertion.  She has been taking TheraFlu and NyQuil for symptoms with minimal relief.  She mostly complains of increased fatigue lately.  She has had some diarrhea today, but no vomiting.  She has been around coworkers at her job who were sick with similar symptoms.  No documented fever prior to arrival.      Past Medical History:  Diagnosis Date  . Anemia   . Hypertension   . OSA (obstructive sleep apnea)     Patient Active Problem List   Diagnosis Date Noted  . OSA (obstructive sleep apnea)   . Preventative health care 10/17/2014  . Borderline diabetes 09/10/2014  . Hyperlipidemia 09/10/2014  . Knee pain 09/10/2014  . Numbness 08/29/2014  . CTS (carpal tunnel syndrome) 08/29/2014  . TIA (transient ischemic attack) 08/28/2014  . Syncope 08/28/2014  . Essential hypertension 08/28/2014  . Morbid obesity ([place]) 08/28/2014    Past Surgical History:  Procedure Laterality Date  . CESAREAN SECTION     x 3    OB History    No data available       Home Medications    Prior to Admission medications   Medication Sig Start Date End Date Taking? Authorizing Provider  benzonatate (TESSALON) 100 MG capsule Take 1-2 capsules (100-200 mg total) by mouth 3 (three) times daily as needed for cough. 09/24/17   Antonietta Breach, PA-C  meclizine (ANTIVERT) 25 MG tablet Take 1 tablet (25 mg total) by mouth 3 (three) times daily as  needed for dizziness. 11/20/16   Debbrah Alar, NP  metoprolol succinate (TOPROL-XL) 50 MG 24 hr tablet Take 1 tablet (50 mg total) by mouth daily. Take with or immediately following a meal. 01/14/17   Debbrah Alar, NP  Multiple Vitamins-Minerals (MULTIVITAMIN WITH MINERALS) tablet Take 1 tablet by mouth daily. 09/10/14   Debbrah Alar, NP  naproxen (NAPROSYN) 500 MG tablet Take 1 tablet (500 mg total) by mouth 2 (two) times daily with a meal. For body aches or mild/moderate pain 09/24/17   Antonietta Breach, PA-C  SUMAtriptan (IMITREX) 50 MG tablet One tab at start of headache, may repeat in 2 hours if needed. Max 2 tabs/24 hours. 11/20/16   Debbrah Alar, NP    Family History Family History  Problem Relation Age of Onset  . Cancer Mother 22       breast  . Hypertension Father   . Diabetes Father   . Diabetes Paternal Grandmother   . Hypertension Paternal Grandmother   . Diabetes Paternal Grandfather   . Hypertension Paternal Grandfather     Social History Social History   Tobacco Use  . Smoking status: Never Smoker  . Smokeless tobacco: Never Used  Substance Use Topics  . Alcohol use: Yes    Alcohol/week: 0.0 oz    Comment: occasional glass of wine  . Drug use: No     Allergies   Atorvastatin  and Penicillins   Review of Systems Review of Systems Ten systems reviewed and are negative for acute change, except as noted in the HPI.    Physical Exam Updated Vital Signs BP 131/80   Pulse 81   Temp 98.3 F (36.8 C) (Oral)   Resp 18   Ht 5\' 5"  (1.651 m)   Wt (!) 182.8 kg (403 lb)   LMP 08/27/2017 (Approximate)   SpO2 99%   BMI 67.06 kg/m   Physical Exam  Constitutional: She is oriented to person, place, and time. She appears well-developed and well-nourished. No distress.  Nontoxic appearing and in NAD. Morbidly obese.  HENT:  Head: Normocephalic and atraumatic.  Eyes: Conjunctivae and EOM are normal. No scleral icterus.  Neck: Normal range of  motion.  No meningismus  Cardiovascular: Normal rate, regular rhythm and intact distal pulses.  Pulmonary/Chest: Effort normal. No stridor. No respiratory distress. She has no wheezes. She has no rales.  Respirations even and unlabored  Musculoskeletal: Normal range of motion.  Neurological: She is alert and oriented to person, place, and time. She exhibits normal muscle tone.  Ambulatory with steady gait.  Skin: Skin is warm and dry. No rash noted. She is not diaphoretic. No erythema. No pallor.  Psychiatric: She has a normal mood and affect. Her behavior is normal.  Nursing note and vitals reviewed.    ED Treatments / Results  Labs (all labs ordered are listed, but only abnormal results are displayed) Labs Reviewed - No data to display  EKG  EKG Interpretation None       Radiology Dg Chest 2 View  Result Date: 09/24/2017 CLINICAL DATA:  Cough and congestion EXAM: CHEST - 2 VIEW COMPARISON:  August 28, 2014 chest CT FINDINGS: Lungs are clear. Heart size and pulmonary vascularity are normal. No adenopathy. No bone lesions. IMPRESSION: No edema or consolidation. Electronically Signed   By: Lowella Grip III M.D.   On: 09/24/2017 13:19    Procedures Procedures (including critical care time)  Medications Ordered in ED Medications  benzonatate (TESSALON) capsule 200 mg (200 mg Oral Given 09/24/17 1330)  loratadine (CLARITIN) tablet 10 mg (10 mg Oral Given 09/24/17 1330)  naproxen (NAPROSYN) tablet 500 mg (500 mg Oral Given 09/24/17 1330)     Initial Impression / Assessment and Plan / ED Course  I have reviewed the triage vital signs and the nursing notes.  Pertinent labs & imaging results that were available during my care of the patient were reviewed by me and considered in my medical decision making (see chart for details).     Patients symptoms are consistent with flu-like illness. CXR negative for acute infiltrate or PNA. Discussed that antibiotics are not  indicated for viral infections. Patient will be discharged with symptomatic treatment.  She verbalizes understanding and is agreeable with plan. Patient is hemodynamically stable and in NAD prior to discharge.  Vitals:   09/24/17 1243 09/24/17 1244 09/24/17 1331  BP: 130/67  131/80  Pulse: 72  81  Resp: 16  18  Temp: 98.3 F (36.8 C)    TempSrc: Oral    SpO2: 96%  99%  Weight:  (!) 182.8 kg (403 lb)   Height:  5\' 5"  (1.651 m)     Final Clinical Impressions(s) / ED Diagnoses   Final diagnoses:  Influenza-like illness    ED Discharge Orders        Ordered    benzonatate (TESSALON) 100 MG capsule  3 times daily PRN  09/24/17 1327    naproxen (NAPROSYN) 500 MG tablet  2 times daily with meals     09/24/17 1327       Antonietta Breach, PA-C 09/24/17 1410    Hayden Rasmussen, MD 09/25/17 [phone]

## 2017-09-24 NOTE — Discharge Instructions (Signed)
Take Tessalon for cough and Naproxen for pain/body aches. We advise the use of Zyrtec of Claritin for congestion. These can be purchased over the counter at your local pharmacy. Continue to get plenty of rest and drink plenty of fluids. Follow up with your primary care doctor to ensure resolution of symptoms.

## 2018-02-04 LAB — HM MAMMOGRAPHY

## 2018-05-28 ENCOUNTER — Other Ambulatory Visit: Payer: Self-pay

## 2018-05-28 ENCOUNTER — Encounter (HOSPITAL_BASED_OUTPATIENT_CLINIC_OR_DEPARTMENT_OTHER): Payer: Self-pay | Admitting: Emergency Medicine

## 2018-05-28 ENCOUNTER — Emergency Department (HOSPITAL_BASED_OUTPATIENT_CLINIC_OR_DEPARTMENT_OTHER)
Admission: EM | Admit: 2018-05-28 | Discharge: 2018-05-28 | Disposition: A | Discharge status: Home / Self Care | Payer: 59 | Attending: Emergency Medicine | Admitting: Emergency Medicine

## 2018-05-28 ENCOUNTER — Emergency Department (HOSPITAL_BASED_OUTPATIENT_CLINIC_OR_DEPARTMENT_OTHER): Payer: 59

## 2018-05-28 DIAGNOSIS — M79672 Pain in left foot: Secondary | ICD-10-CM | POA: Insufficient documentation

## 2018-05-28 DIAGNOSIS — I10 Essential (primary) hypertension: Secondary | ICD-10-CM | POA: Diagnosis not present

## 2018-05-28 DIAGNOSIS — Z79899 Other long term (current) drug therapy: Secondary | ICD-10-CM | POA: Insufficient documentation

## 2018-05-28 DIAGNOSIS — Z8673 Personal history of transient ischemic attack (TIA), and cerebral infarction without residual deficits: Secondary | ICD-10-CM | POA: Diagnosis not present

## 2018-05-28 MED ORDER — NAPROXEN 500 MG PO TABS: 500.0000 mg | ORAL_TABLET | Freq: Two times a day (BID) | ORAL | 0 refills | Status: AC

## 2018-05-28 NOTE — Discharge Instructions (Signed)
Your xray today was within normal limits. I have prescribed antiinflammatory's to help with your swelling.Please schedule an appointment with your primary care physician to further evaluate your condition.

## 2018-05-28 NOTE — ED Triage Notes (Signed)
Reports swelling to left foot since last week.  Reports pain radiates into toes.

## 2018-05-28 NOTE — ED Provider Notes (Signed)
[place] EMERGENCY DEPARTMENT Provider Note   CSN: [id number] Arrival date & time: 05/28/18  1240     History   Chief Complaint Chief Complaint  Patient presents with  . Foot Pain    HPI Angela Tucker is a 47 y.o. female.  47 y/o female with a PMH of HTN, OSA presents to the ED with a chief complaint of left foot pain x 1 week. Patient reports she noticed a swelling prominence on her left foot after being out of town on vacation. She then reports today experiencing a sharp pain to her left big toe along with long toe.She has tried some aspirin for her pain but denies any relieve in symptoms. She states the pain is worse with walking. She denies any fever, trauma, calf tenderness or previous history of gout.      Past Medical History:  Diagnosis Date  . Anemia   . Hypertension   . OSA (obstructive sleep apnea)     Patient Active Problem List   Diagnosis Date Noted  . OSA (obstructive sleep apnea)   . Preventative health care 10/17/2014  . Borderline diabetes 09/10/2014  . Hyperlipidemia 09/10/2014  . Knee pain 09/10/2014  . Numbness 08/29/2014  . CTS (carpal tunnel syndrome) 08/29/2014  . TIA (transient ischemic attack) 08/28/2014  . Syncope 08/28/2014  . Essential hypertension 08/28/2014  . Morbid obesity ([place]) 08/28/2014    Past Surgical History:  Procedure Laterality Date  . CESAREAN SECTION     x 3     OB History   None      Home Medications    Prior to Admission medications   Medication Sig Start Date End Date Taking? Authorizing Provider  benzonatate (TESSALON) 100 MG capsule Take 1-2 capsules (100-200 mg total) by mouth 3 (three) times daily as needed for cough. 09/24/17   Antonietta Breach, PA-C  meclizine (ANTIVERT) 25 MG tablet Take 1 tablet (25 mg total) by mouth 3 (three) times daily as needed for dizziness. 11/20/16   Debbrah Alar, NP  metoprolol succinate (TOPROL-XL) 50 MG 24 hr tablet Take 1 tablet (50 mg total) by mouth  daily. Take with or immediately following a meal. 01/14/17   Debbrah Alar, NP  Multiple Vitamins-Minerals (MULTIVITAMIN WITH MINERALS) tablet Take 1 tablet by mouth daily. 09/10/14   Debbrah Alar, NP  naproxen (NAPROSYN) 500 MG tablet Take 1 tablet (500 mg total) by mouth 2 (two) times daily for 7 days. 05/28/18 06/04/18  Janeece Fitting, PA-C  SUMAtriptan (IMITREX) 50 MG tablet One tab at start of headache, may repeat in 2 hours if needed. Max 2 tabs/24 hours. 11/20/16   Debbrah Alar, NP    Family History Family History  Problem Relation Age of Onset  . Cancer Mother 39       breast  . Hypertension Father   . Diabetes Father   . Diabetes Paternal Grandmother   . Hypertension Paternal Grandmother   . Diabetes Paternal Grandfather   . Hypertension Paternal Grandfather     Social History Social History   Tobacco Use  . Smoking status: Never Smoker  . Smokeless tobacco: Never Used  Substance Use Topics  . Alcohol use: Yes    Alcohol/week: 0.0 standard drinks    Comment: occasional glass of wine  . Drug use: No     Allergies   Atorvastatin and Penicillins   Review of Systems Review of Systems  Constitutional: Negative for fever.  Musculoskeletal: Positive for arthralgias and myalgias.  Physical Exam Updated Vital Signs BP (!) 135/92 (BP Location: Left Arm)   Pulse 83   Temp 97.7 F (36.5 C) (Oral)   Resp (!) 22   Ht 5\' 5"  (1.651 m)   Wt (!) 180.5 kg   SpO2 100%   BMI 66.23 kg/m   Physical Exam  Constitutional: She is oriented to person, place, and time. She appears well-developed and well-nourished. No distress.  HENT:  Head: Normocephalic and atraumatic.  Mouth/Throat: Oropharynx is clear and moist. No oropharyngeal exudate.  Eyes: Pupils are equal, round, and reactive to light.  Neck: Normal range of motion.  Cardiovascular: Regular rhythm and normal heart sounds.  Pulses:      Dorsalis pedis pulses are 2+ on the left side.        Posterior tibial pulses are 2+ on the left side.  Pulmonary/Chest: Effort normal and breath sounds normal. No respiratory distress.  Abdominal: Soft. Bowel sounds are normal. She exhibits no distension. There is no tenderness.  Musculoskeletal: She exhibits no tenderness.       Right lower leg: She exhibits no edema.       Left lower leg: She exhibits no edema.       Left foot: There is swelling and deformity. There is no tenderness, normal capillary refill and no laceration.       Feet:  Feet:  Left Foot:  Skin Integrity: Positive for warmth, callus and dry skin. Negative for ulcer, blister or erythema.  Neurological: She is alert and oriented to person, place, and time.  Skin: Skin is warm and dry.  Psychiatric: She has a normal mood and affect.  Nursing note and vitals reviewed.    ED Treatments / Results  Labs (all labs ordered are listed, but only abnormal results are displayed) Labs Reviewed - No data to display  EKG None  Radiology Dg Foot Complete Left  Result Date: 05/28/2018 CLINICAL DATA:  Palpable abnormality along the dorsal aspect of the foot. No acute injury is noted. EXAM: LEFT FOOT - COMPLETE 3+ VIEW COMPARISON:  None. FINDINGS: No acute fracture or dislocation is noted. Soft tissue prominence is noted over the tarsal bones dorsally. No associated bony abnormality is noted. Small calcaneal spur is seen. IMPRESSION: Soft tissue prominence without underlying bony abnormality. Electronically Signed   By: Inez Catalina M.D.   On: 05/28/2018 13:15    Procedures Procedures (including critical care time)  Medications Ordered in ED Medications - No data to display   Initial Impression / Assessment and Plan / ED Course  I have reviewed the triage vital signs and the nursing notes.  Pertinent labs & imaging results that were available during my care of the patient were reviewed by me and considered in my medical decision making (see chart for details).    Present  with left foot pain which began a week ago.  Today she had that worked her up from her sleep and states that the pain was mainly located along her big toe along with the toe adjacent to it.  She reports taking some aspirin for relief but had no relieving symptoms.  Recently on vacation with family states there was a lot of walking around eyes any trauma or fever.  Vice patient that we will treat this as a musculoskeletal issue as she has no previous history of gout but denies drinking alcohol but reports eating meat.  I have advised her to follow-up with her primary care physician this week to further evaluate  her symptoms.  Patient understands and agrees with management.  Return precautions provided.  Final Clinical Impressions(s) / ED Diagnoses   Final diagnoses:  Foot pain, left    ED Discharge Orders         Ordered    naproxen (NAPROSYN) 500 MG tablet  2 times daily     05/28/18 1429           Janeece Fitting, PA-C 05/28/18 1431    Charlesetta Shanks, MD 05/29/18 0725

## 2018-09-12 ENCOUNTER — Encounter: Payer: Self-pay | Admitting: Family

## 2018-09-12 ENCOUNTER — Telehealth: Payer: Self-pay | Admitting: Family

## 2018-09-12 ENCOUNTER — Ambulatory Visit (INDEPENDENT_AMBULATORY_CARE_PROVIDER_SITE_OTHER): Payer: 59 | Admitting: Family

## 2018-09-12 VITALS — BP 154/98 | HR 71 | Temp 98.1°F | Resp 16 | Ht 64.0 in | Wt >= 6400 oz

## 2018-09-12 DIAGNOSIS — Z8739 Personal history of other diseases of the musculoskeletal system and connective tissue: Secondary | ICD-10-CM

## 2018-09-12 DIAGNOSIS — I10 Essential (primary) hypertension: Secondary | ICD-10-CM

## 2018-09-12 DIAGNOSIS — L659 Nonscarring hair loss, unspecified: Secondary | ICD-10-CM | POA: Diagnosis not present

## 2018-09-12 MED ORDER — METOPROLOL SUCCINATE ER 50 MG PO TB24: 50.0000 mg | ORAL_TABLET | Freq: Every day | ORAL | 3 refills | Status: DC

## 2018-09-12 NOTE — Patient Instructions (Signed)
Please begin toprol xl for blood pressure. You should be contacted about your referral to the dermatologist.

## 2018-09-12 NOTE — Telephone Encounter (Signed)
Please call Dr. Malachi Carl office and request copy of last mammogram.

## 2018-09-12 NOTE — Progress Notes (Signed)
Subjective:    Patient ID: Angela Tucker, female    DOB: 05-17-1971, 48 y.o.   MRN: [id number]  HPI  Patient is a 48 yr old female who presents today with chief complaint of LE edema.  The last time we saw her was 01/14/17.   At that time we increased her toprol xl from 25mg  to 50mg  once daily.   BP Readings from Last 3 Encounters:  09/12/18 (!) 154/98  05/28/18 (!) 143/87  09/24/17 131/80   Alopecia-  Reports loss of hair left eyebrow. Another spot on the back of her scalp.  She has an appointment with dermatology in June which was the earliest visit she could get.   She had a visit to the ED back in June for gout- notes no recent symptoms.   Review of Systems    see HPI  Past Medical History:  Diagnosis Date  . Anemia   . Hypertension   . OSA (obstructive sleep apnea)      Social History   Socioeconomic History  . Marital status: Married    Spouse name: Not on file  . Number of children: Not on file  . Years of education: Not on file  . Highest education level: Not on file  Occupational History  . Not on file  Social Needs  . Financial resource strain: Not on file  . Food insecurity:    Worry: Not on file    Inability: Not on file  . Transportation needs:    Medical: Not on file    Non-medical: Not on file  Tobacco Use  . Smoking status: Never Smoker  . Smokeless tobacco: Never Used  Substance and Sexual Activity  . Alcohol use: Yes    Alcohol/week: 0.0 standard drinks    Comment: occasional glass of wine  . Drug use: No  . Sexual activity: Not on file  Lifestyle  . Physical activity:    Days per week: Not on file    Minutes per session: Not on file  . Stress: Not on file  Relationships  . Social connections:    Talks on phone: Not on file    Gets together: Not on file    Attends religious service: Not on file    Active member of club or organization: Not on file    Attends meetings of clubs or organizations: Not on file    Relationship status:  Not on file  . Intimate partner violence:    Fear of current or ex partner: Not on file    Emotionally abused: Not on file    Physically abused: Not on file    Forced sexual activity: Not on file  Other Topics Concern  . Not on file  Social History Narrative   Works at [employer]   Married   3 children   Born 1997- Son [name]   2002- [name] daughter   2004- [name] son   Working on her masters    Past Surgical History:  Procedure Laterality Date  . CESAREAN SECTION     x 3    Family History  Problem Relation Age of Onset  . Cancer Mother 45       breast  . Hypertension Father   . Diabetes Father   . Diabetes Paternal Grandmother   . Hypertension Paternal Grandmother   . Diabetes Paternal Grandfather   . Hypertension Paternal Grandfather     Allergies  Allergen Reactions  . Atorvastatin Nausea Only  .  Penicillins Hives    No current outpatient medications on file prior to visit.   No current facility-administered medications on file prior to visit.     BP (!) 154/98 (BP Location: Right Arm, Patient Position: Sitting, Cuff Size: Large)   Pulse 71   Temp 98.1 F (36.7 C) (Oral)   Resp 16   Ht 5\' 4"  (1.626 m)   Wt (!) 413 lb (187.3 kg)   SpO2 98%   BMI 70.89 kg/m    Objective:   Physical Exam Constitutional:      Appearance: She is well-developed. She is obese. She is not ill-appearing.  Neck:     Musculoskeletal: Neck supple.     Thyroid: No thyromegaly.  Cardiovascular:     Rate and Rhythm: Normal rate and regular rhythm.     Heart sounds: Normal heart sounds. No murmur.  Pulmonary:     Effort: Pulmonary effort is normal. No respiratory distress.     Breath sounds: Normal breath sounds. No wheezing.  Musculoskeletal:     Right lower leg: 2+ Edema present.     Left lower leg: 2+ Edema present.  Skin:    General: Skin is warm and dry.     Comments: Absence of left eyebrow. Scalp not examined due to hair weave  Neurological:     Mental Status: She  is alert and oriented to person, place, and time.  Psychiatric:        Behavior: Behavior normal.        Thought Content: Thought content normal.        Judgment: Judgment normal.           Assessment & Plan:  HTN- uncontrolled. I suspect that this is contributing to her LE edema. Restart toprol xl 50mg . Importance of compliance with BP meds and long term consequences of uncontrolled HTN is reviewed with the patient today.   Hx of gout- no recent flares- will monitor. Hope to avoid addition of diuretics as this may worsen gout.  Alopecia- will see if we can get her in sooner with dermatology in [place]. Referral placed.

## 2018-09-16 NOTE — Telephone Encounter (Signed)
Test requested

## 2018-10-04 ENCOUNTER — Encounter: Payer: 59 | Admitting: Family

## 2018-10-05 ENCOUNTER — Encounter: Payer: 59 | Admitting: Family

## 2019-03-14 LAB — HM MAMMOGRAPHY

## 2019-03-21 ENCOUNTER — Encounter: Payer: Self-pay | Admitting: Family

## 2019-03-21 ENCOUNTER — Ambulatory Visit (INDEPENDENT_AMBULATORY_CARE_PROVIDER_SITE_OTHER): Payer: 59 | Admitting: Family

## 2019-03-21 ENCOUNTER — Other Ambulatory Visit: Payer: Self-pay

## 2019-03-21 ENCOUNTER — Telehealth: Payer: Self-pay | Admitting: Family

## 2019-03-21 VITALS — BP 151/59 | HR 71 | Temp 97.3°F | Resp 16 | Ht 64.0 in | Wt >= 6400 oz

## 2019-03-21 DIAGNOSIS — L659 Nonscarring hair loss, unspecified: Secondary | ICD-10-CM

## 2019-03-21 DIAGNOSIS — G4733 Obstructive sleep apnea (adult) (pediatric): Secondary | ICD-10-CM

## 2019-03-21 DIAGNOSIS — Z0000 Encounter for general adult medical examination without abnormal findings: Secondary | ICD-10-CM | POA: Diagnosis not present

## 2019-03-21 DIAGNOSIS — I10 Essential (primary) hypertension: Secondary | ICD-10-CM

## 2019-03-21 LAB — HEPATIC FUNCTION PANEL
ALT: 12 U/L (ref 0–35)
AST: 9 U/L (ref 0–37)
Albumin: 3.8 g/dL (ref 3.5–5.2)
Alkaline Phosphatase: 78 U/L (ref 39–117)
Bilirubin, Direct: 0.1 mg/dL (ref 0.0–0.3)
Total Bilirubin: 0.3 mg/dL (ref 0.2–1.2)
Total Protein: 6.7 g/dL (ref 6.0–8.3)

## 2019-03-21 LAB — BASIC METABOLIC PANEL
BUN: 14 mg/dL (ref 6–23)
CO2: 28 mEq/L (ref 19–32)
Calcium: 9.5 mg/dL (ref 8.4–10.5)
Chloride: 105 mEq/L (ref 96–112)
Creatinine, Ser: 0.83 mg/dL (ref 0.40–1.20)
GFR: 88.46 mL/min (ref >60.00)
Glucose, Bld: 97 mg/dL (ref 70–99)
Potassium: 4.2 mEq/L (ref 3.5–5.1)
Sodium: 141 mEq/L (ref 135–145)

## 2019-03-21 LAB — CBC WITH DIFFERENTIAL/PLATELET
Basophils Absolute: 0.1 10*3/uL (ref 0.0–0.1)
Basophils Relative: 0.8 % (ref 0.0–3.0)
Eosinophils Absolute: 0.2 10*3/uL (ref 0.0–0.7)
Eosinophils Relative: 1.7 % (ref 0.0–5.0)
HCT: 35.3 % — ABNORMAL LOW (ref 36.0–46.0)
Hemoglobin: 11.1 g/dL — ABNORMAL LOW (ref 12.0–15.0)
Lymphocytes Relative: 38.4 % (ref 12.0–46.0)
Lymphs Abs: 4.2 10*3/uL — ABNORMAL HIGH (ref 0.7–4.0)
MCHC: 31.5 g/dL (ref 30.0–36.0)
MCV: 80 fl (ref 78.0–100.0)
Monocytes Absolute: 0.5 10*3/uL (ref 0.1–1.0)
Monocytes Relative: 5 % (ref 3.0–12.0)
Neutro Abs: 6 10*3/uL (ref 1.4–7.7)
Neutrophils Relative %: 54.1 % (ref 43.0–77.0)
Platelets: 506 10*3/uL — ABNORMAL HIGH (ref 150.0–400.0)
RBC: 4.42 Mil/uL (ref 3.87–5.11)
RDW: 16.4 % — ABNORMAL HIGH (ref 11.5–15.5)
WBC: 11 10*3/uL — ABNORMAL HIGH (ref 4.0–10.5)

## 2019-03-21 LAB — LIPID PANEL
Cholesterol: 176 mg/dL (ref 0–200)
HDL: 34.4 mg/dL — ABNORMAL LOW (ref >39.00)
LDL Cholesterol: 123 mg/dL — ABNORMAL HIGH (ref 0–99)
NonHDL: 141.67
Total CHOL/HDL Ratio: 5
Triglycerides: 91 mg/dL (ref 0.0–149.0)
VLDL: 18.2 mg/dL (ref 0.0–40.0)

## 2019-03-21 LAB — URINALYSIS, ROUTINE W REFLEX MICROSCOPIC
Bilirubin Urine: NEGATIVE
Hgb urine dipstick: NEGATIVE
Ketones, ur: NEGATIVE
Leukocytes,Ua: NEGATIVE
Nitrite: NEGATIVE
RBC / HPF: NONE SEEN (ref 0–?)
Specific Gravity, Urine: >=1.03 — AB (ref 1.000–1.030)
Total Protein, Urine: NEGATIVE
Urine Glucose: NEGATIVE
Urobilinogen, UA: 0.2 (ref 0.0–1.0)
pH: 5.5 (ref 5.0–8.0)

## 2019-03-21 LAB — TSH: TSH: 1.34 u[IU]/mL (ref 0.35–4.50)

## 2019-03-21 MED ORDER — METOPROLOL SUCCINATE ER 50 MG PO TB24: 50.0000 mg | ORAL_TABLET | Freq: Every day | ORAL | 5 refills | Status: DC

## 2019-03-21 NOTE — Progress Notes (Signed)
Subjective:    Patient ID: Angela Tucker, female    DOB: 11-25-1970, 48 y.o.   MRN: [id number]  HPI  Patient presents today for complete physical.  Immunizations: Tdap up to date, declines flu shot Diet: reports that her weight is down 2 pounds.   Wt Readings from Last 3 Encounters:  03/21/19 (!) 413 lb 9.6 oz (187.6 kg)  09/12/18 (!) 413 lb (187.3 kg)  05/28/18 (!) 398 lb (180.5 kg)  Exercise: none  Breakfast- eggs/bacon with cheese or sausage, maybe grits, water or no sugar cranberry juice Lunch-  sandwhich (buys a steak and cheese sub from [employer]), drinks water or cranberry juice Dinner-  Grilled meat, rice or potatoes, broccoli or cauliflower, dinner cranberry juice or water sherbert at night sometimes, non-dairy tolenti ice cream Pap Smear:  Up to date Mammogram: Up to date  HTN- She is not currently taking toprol xl.   BP Readings from Last 3 Encounters:  03/21/19 (!) 151/59  09/12/18 (!) 154/98  05/28/18 (!) 143/87   OSA- never tried cpap.  Denies daytime somnolence. + snoring.  Had sleep study 2017 which noted mild OSA.  Weight was about the same at that time.    Alopecia- she reports areas of hair loss on her scalp, Requesting referral to dermatology.   Review of Systems  Constitutional: Negative for unexpected weight change.  HENT: Negative for hearing loss and rhinorrhea.   Eyes: Negative for visual disturbance.  Respiratory: Negative for cough and shortness of breath.   Cardiovascular: Negative for chest pain and leg swelling.  Gastrointestinal: Negative for constipation and diarrhea.  Genitourinary: Negative for dysuria, frequency and hematuria.  Musculoskeletal: Negative for arthralgias and myalgias.  Skin: Negative for rash.  Neurological: Negative for headaches.  Hematological: Negative for adenopathy.  Psychiatric/Behavioral:       Denies depression/anxiety        Past Medical History:  Diagnosis Date  . Anemia   . Hypertension   .  OSA (obstructive sleep apnea)      Social History   Socioeconomic History  . Marital status: Married    Spouse name: Not on file  . Number of children: Not on file  . Years of education: Not on file  . Highest education level: Not on file  Occupational History  . Not on file  Social Needs  . Financial resource strain: Not on file  . Food insecurity    Worry: Not on file    Inability: Not on file  . Transportation needs    Medical: Not on file    Non-medical: Not on file  Tobacco Use  . Smoking status: Never Smoker  . Smokeless tobacco: Never Used  Substance and Sexual Activity  . Alcohol use: Yes    Alcohol/week: 0.0 standard drinks    Comment: occasional glass of wine  . Drug use: No  . Sexual activity: Not on file  Lifestyle  . Physical activity    Days per week: Not on file    Minutes per session: Not on file  . Stress: Not on file  Relationships  . Social Herbalist on phone: Not on file    Gets together: Not on file    Attends religious service: Not on file    Active member of club or organization: Not on file    Attends meetings of clubs or organizations: Not on file    Relationship status: Not on file  . Intimate partner violence  Fear of current or ex partner: Not on file    Emotionally abused: Not on file    Physically abused: Not on file    Forced sexual activity: Not on file  Other Topics Concern  . Not on file  Social History Narrative   Works at [employer]   Married   3 children   Born 1997- Son [name]   2002- [name] daughter   2004- [name] son   Working on her masters    Past Surgical History:  Procedure Laterality Date  . CESAREAN SECTION     x 3    Family History  Problem Relation Age of Onset  . Cancer Mother 64       breast  . Hypertension Father   . Diabetes Father   . Diabetes Paternal Grandmother   . Hypertension Paternal Grandmother   . Diabetes Paternal Grandfather   . Hypertension Paternal Grandfather      Allergies  Allergen Reactions  . Atorvastatin Nausea Only  . Penicillins Hives    Current Outpatient Medications on File Prior to Visit  Medication Sig Dispense Refill  . norethindrone-ethinyl estradiol (FEMHRT 1/5) 1-5 MG-MCG TABS tablet Take 1 tablet by mouth daily.    . metoprolol succinate (TOPROL-XL) 50 MG 24 hr tablet Take 1 tablet (50 mg total) by mouth daily. Take with or immediately following a meal. (Patient not taking: Reported on 03/21/2019) 30 tablet 3   No current facility-administered medications on file prior to visit.     BP (!) 151/59 (BP Location: Right Wrist, Cuff Size: Normal)   Pulse 71   Temp (!) 97.3 F (36.3 C) (Temporal)   Resp 16   Ht 5\' 4"  (1.626 m)   Wt (!) 413 lb 9.6 oz (187.6 kg)   LMP 02/28/2019   SpO2 97%   BMI 70.99 kg/m    Objective:   Physical Exam Physical Exam  Constitutional: She is oriented to person, place, and time. She appears well-developed and well-nourished. No distress.  HENT:  Head: Normocephalic and atraumatic.  Right Ear: Tympanic membrane and ear canal normal.  Left Ear: Tympanic membrane and ear canal normal.  Mouth/Throat: Not examined, pt wearing mask for covid-19 prevention Eyes: Pupils are equal, round, and reactive to light. No scleral icterus.  Neck: Normal range of motion. No thyromegaly present.  Cardiovascular: Normal rate and regular rhythm.   No murmur heard. Pulmonary/Chest: Effort normal and breath sounds normal. No respiratory distress. He has no wheezes. She has no rales. She exhibits no tenderness.  Abdominal: Soft. Bowel sounds are normal. She exhibits no distension and no mass. There is no tenderness. There is no rebound and no guarding.  Musculoskeletal: She exhibits no edema.  Lymphadenopathy:    She has no cervical adenopathy.  Neurological: She is alert and oriented to person, place, and time. She has normal patellar reflexes. She exhibits normal muscle tone. Coordination normal.  Skin: Skin is warm  and dry. small patch of hair loss noted on anterior scalp Psychiatric: She has a normal mood and affect. Her behavior is normal. Judgment and thought content normal.  Breast/pelvic: deferred       Assessment & Plan:   Preventative care- discussed importance of weight loss for her health. A referral was made to the medical weight loss clinic. Also, specifically recommended the following changes:  Try to get some regular exercise with a goal of working up to 30 minutes 5 days a week.   Breakfast-  1 egg +  2 egg whites scrambled,  [place] bacon or [place] sausage, water or sugar free cranberry juice,  Fresh fruit   Lunch  Deli [place], 1 tablespoon low fat mayo, slice of cheese, whole grain bread Serving of fresh fruit/veggie  Dinner- grilled lean meat/fish, veggie of choice, carb of choice   Evenings- fresh fruit if hungry.  HTN- BP uncontrolled. Restart metoprolol. Reinforced importance of compliance.  BP Readings from Last 3 Encounters:  03/21/19 (!) 151/59  09/12/18 (!) 154/98  05/28/18 (!) 143/87   Alopecia- refer to dermatology   OSA- encouraged weight loss.     Assessment & Plan:

## 2019-03-21 NOTE — Patient Instructions (Addendum)
  Work on the following dietary changes as below and try to get some regular exercise with a goal of working up to 30 minutes 5 days a week.   Restart metoprolol. You should be contacted about your referral to dermatology and to the medical weight loss clinic.   Breakfast-  1 egg + 2 egg whites scrambled,  [place] bacon or [place] sausage, water or sugar free cranberry juice,  Fresh fruit   Lunch  Deli [place], 1 tablespoon low fat mayo, slice of cheese, whole grain bread Serving of fresh fruit/veggie  Dinner- grilled lean meat/fish, veggie of choice, carb of choice   Evenings- fresh fruit if hungry.

## 2019-03-21 NOTE — Telephone Encounter (Signed)
Please call Dr. Philis Pique and request copy of mammogram and pap.

## 2019-03-22 ENCOUNTER — Encounter: Payer: Self-pay | Admitting: Family

## 2019-03-22 NOTE — Telephone Encounter (Signed)
Medical Records request for continuity of care faxed to [facility] at [phone].

## 2019-05-29 IMAGING — DX DG FOOT COMPLETE 3+V*L*
3 series · 3 of 3 positions shown · non-contrast
Comparison: None.

CLINICAL DATA: Palpable abnormality along the dorsal aspect of the
foot. No acute injury is noted.

EXAM:
LEFT FOOT - COMPLETE 3+ VIEW

[foot ap]
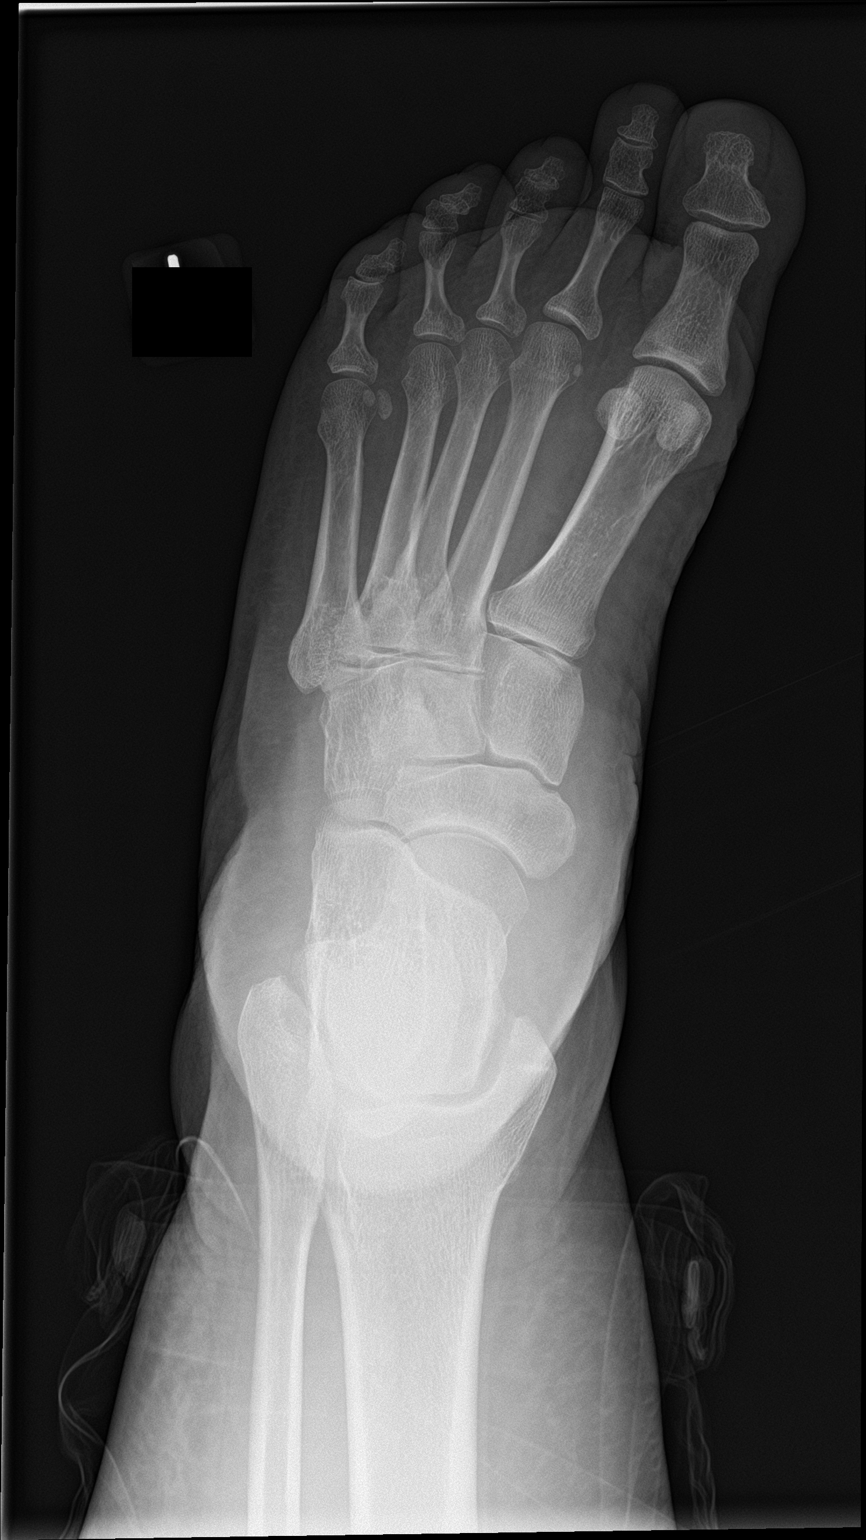

[foot obl]
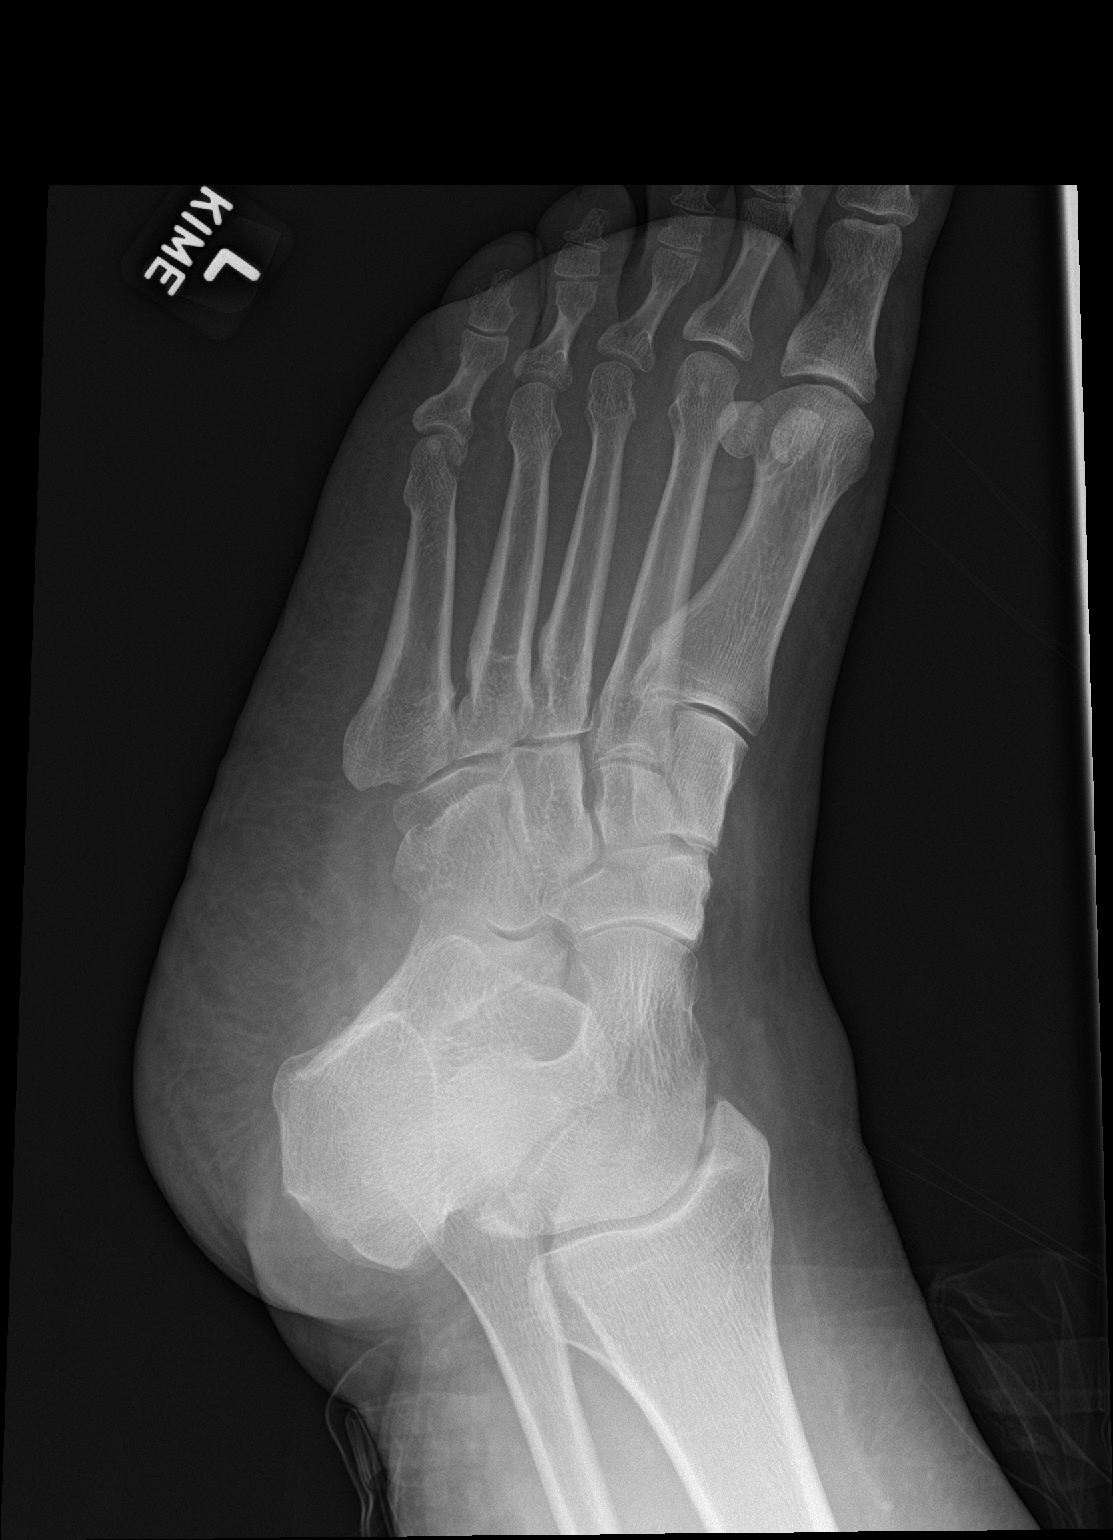

[foot lat]
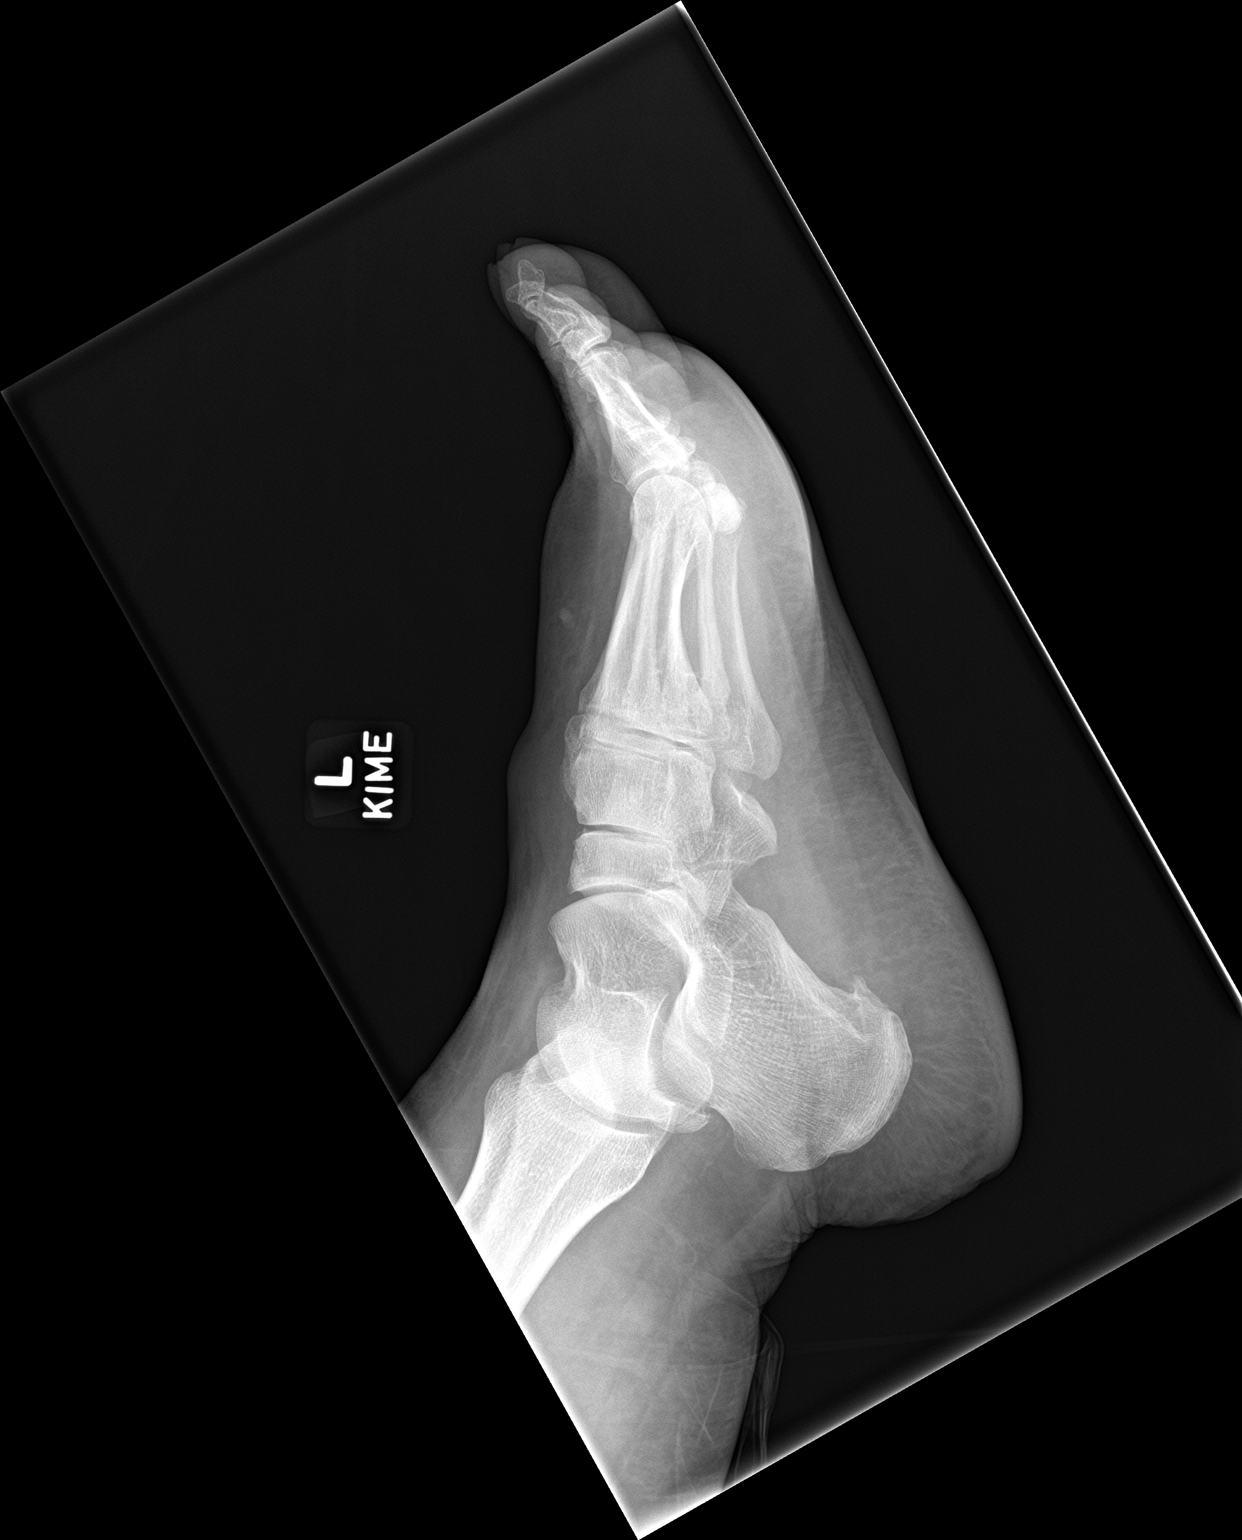

[3 of 3 positions shown; findings below may reference images not displayed]

FINDINGS: No acute fracture or dislocation is noted. Soft tissue prominence is
noted over the tarsal bones dorsally. No associated bony abnormality
is noted. Small calcaneal spur is seen.
IMPRESSION: Soft tissue prominence without underlying bony abnormality.

## 2019-06-23 ENCOUNTER — Ambulatory Visit: Payer: 59 | Admitting: Family

## 2019-07-18 ENCOUNTER — Ambulatory Visit: Payer: 59 | Admitting: Family

## 2019-08-16 LAB — LIPID PANEL
Cholesterol: 187 (ref 0–200)
HDL: 39 (ref 35–70)
LDL Cholesterol: 129
LDl/HDL Ratio: 4.8
Triglycerides: 91 (ref 40–160)

## 2019-08-16 LAB — BASIC METABOLIC PANEL: Glucose: 105

## 2019-09-19 ENCOUNTER — Ambulatory Visit (INDEPENDENT_AMBULATORY_CARE_PROVIDER_SITE_OTHER): Payer: 59 | Admitting: Family Medicine

## 2019-09-19 ENCOUNTER — Encounter (INDEPENDENT_AMBULATORY_CARE_PROVIDER_SITE_OTHER): Payer: Self-pay | Admitting: Family Medicine

## 2019-09-19 ENCOUNTER — Other Ambulatory Visit: Payer: Self-pay

## 2019-09-19 VITALS — BP 172/79 | HR 73 | Temp 97.8°F | Ht 64.0 in | Wt >= 6400 oz

## 2019-09-19 DIAGNOSIS — G4733 Obstructive sleep apnea (adult) (pediatric): Secondary | ICD-10-CM

## 2019-09-19 DIAGNOSIS — R7303 Prediabetes: Secondary | ICD-10-CM

## 2019-09-19 DIAGNOSIS — R0602 Shortness of breath: Secondary | ICD-10-CM

## 2019-09-19 DIAGNOSIS — R5383 Other fatigue: Secondary | ICD-10-CM

## 2019-09-19 DIAGNOSIS — I10 Essential (primary) hypertension: Secondary | ICD-10-CM | POA: Diagnosis not present

## 2019-09-19 DIAGNOSIS — Z9189 Other specified personal risk factors, not elsewhere classified: Secondary | ICD-10-CM

## 2019-09-19 DIAGNOSIS — Z1331 Encounter for screening for depression: Secondary | ICD-10-CM

## 2019-09-19 DIAGNOSIS — Z0289 Encounter for other administrative examinations: Secondary | ICD-10-CM

## 2019-09-19 DIAGNOSIS — Z6841 Body Mass Index (BMI) 40.0 and over, adult: Secondary | ICD-10-CM

## 2019-09-19 DIAGNOSIS — E7849 Other hyperlipidemia: Secondary | ICD-10-CM

## 2019-09-19 NOTE — Progress Notes (Signed)
Dear Debbrah Alar, NP,   Thank you for referring Angela Tucker to our clinic. The following note includes my evaluation and treatment recommendations.  Chief Complaint:   OBESITY Angela Tucker (MR# [id number]) is a 49 y.o. female who presents for evaluation and treatment of obesity and related comorbidities. Current BMI is Body mass index is 71.06 kg/m. [name] has been struggling with her weight for many years and has been unsuccessful in either losing weight, maintaining weight loss, or reaching her healthy weight goal.  [name] is currently in the action stage of change and ready to dedicate time achieving and maintaining a healthier weight. [name] is interested in becoming our patient and working on intensive lifestyle modifications including (but not limited to) diet and exercise for weight loss.  [name] is doing [employer] through work with the [employer].  She is using an app to count calories (2000).  She walks 15 minutes, 3 times a week.  She uses a plate for portion control.  She has increased her water intake by 2 glasses per week.  Her high weight was 423, so she is down 9 pounds in 4 weeks.  [name]'s habits were reviewed today and are as follows: Her family eats meals together, she thinks her family will eat healthier with her, her desired weight loss is 223 pounds, she has been heavy most of her life, she started gaining weight after having children, her heaviest weight ever was 423 pounds, she sometimes craves sweets, she snacks frequently in the evenings, she is frequently drinking liquids with calories, she frequently makes poor food choices and she struggles with emotional eating.  Depression Screen Angela Tucker's Food and Mood (modified PHQ-9) score was 8.  Depression screen PHQ 2/9 09/19/2019  Decreased Interest 1  Down, Depressed, Hopeless 0  PHQ - 2 Score 1  Altered sleeping 1  Tired, decreased energy 2  Change in appetite 1  Feeling bad  or failure about yourself  1  Trouble concentrating 1  Moving slowly or fidgety/restless 1  Suicidal thoughts 0  PHQ-9 Score 8  Difficult doing work/chores Not difficult at all   Subjective:   1. Other fatigue Angela Tucker admits to daytime somnolence and reports waking up still tired. Patent has a history of symptoms of morning fatigue and snoring. Angela Tucker generally gets 6 or 7 hours of sleep per night, and states that she has sometimes restful sleep and sometimes poor sleep. Snoring is sometimes present. Apneic episodes are present. Epworth Sleepiness Score is 5.  2. SOB (shortness of breath) on exertion Angela Tucker notes increasing shortness of breath with exercising and seems to be worsening over time with weight gain. She notes getting out of breath sooner with activity than she used to. This has gotten worse recently. Angela Tucker denies shortness of breath at rest or orthopnea.  3. Essential hypertension Review: taking medications as instructed, no medication side effects noted, no chest pain on exertion, no dyspnea on exertion, no swelling of ankles.   BP Readings from Last 3 Encounters:  09/19/19 (!) 172/79  03/21/19 (!) 151/59  09/12/18 (!) 154/98   4. OSA (obstructive sleep apnea) Angela Tucker has a diagnosis of sleep apnea. She reports that she is not using a CPAP regularly.  Epworth score is 5.  Situation Chance of Dozing or Sleeping  Sitting and reading 1 = slight chance of dozing or sleeping  Watching television 1 = slight chance of dozing or sleeping  Sitting inactive in a public place (theater or meeting)  0 = would never doze or sleep  Sitting as a passenger in a car for an hour 1 = slight chance of dozing or sleeping  Lying down in the afternoon when circumstances permit 1 = slight chance of dozing or sleeping  Sitting and talking to someone 0 = would never doze or sleep  Sitting quietly after lunch without alcohol 1 = slight chance of dozing or sleeping  In a car, while  stopped for a few minutes in traffic 0 = would never doze or sleep  TOTAL 5   5. Prediabetes Angela Tucker has a diagnosis of prediabetes based on her elevated HgA1c and was informed this puts her at greater risk of developing diabetes. She continues to work on diet and exercise to decrease her risk of diabetes. She denies nausea or hypoglycemia.    Lab Results  Component Value Date   HGBA1C 5.7 02/19/2016   6. Other hyperlipidemia Angela Tucker has hyperlipidemia and has been trying to improve her cholesterol levels with intensive lifestyle modification including a low saturated fat diet, exercise and weight loss. She denies any chest pain, claudication or myalgias.  Lab Results  Component Value Date   ALT 12 03/21/2019   AST 9 03/21/2019   ALKPHOS 78 03/21/2019   BILITOT 0.3 03/21/2019   Lab Results  Component Value Date   CHOL 176 03/21/2019   HDL 34.40 (L) 03/21/2019   LDLCALC 123 (H) 03/21/2019   TRIG 91.0 03/21/2019   CHOLHDL 5 03/21/2019   7. Depression screening Angela Tucker was screened for depression as part of her new patient workup today.  8. At risk for heart disease Angela Tucker is at a higher than average risk for cardiovascular disease due to obesity. Reviewed: no chest pain on exertion, no dyspnea on exertion, and no swelling of ankles.  The 10-year ASCVD risk score Angela Tucker DC Angela Tucker., et al., 2013) is: 16.4%*   Values used to calculate the score:     Age: 28 years     Sex: Female     Is Non-Hispanic African American: Yes     Diabetic: No     Tobacco smoker: No     Systolic Blood Pressure: [id number] mmHg     Is BP treated: Yes     HDL Cholesterol: 39 mg/dL*     Total Cholesterol: 187 mg/dL*     * - Cholesterol units were assumed for this score calculation  Assessment/Plan:   1. Other fatigue [name] does feel that her weight is causing her energy to be lower than it should be. Fatigue may be related to obesity, depression or many other causes. Labs will be ordered, and in  the meanwhile, [name] will focus on self care including making healthy food choices, increasing physical activity and focusing on stress reduction.  Orders - EKG 12-Lead  2. SOB (shortness of breath) on exertion [name] does feel that she gets out of breath more easily that she used to when she exercises. Pieper's shortness of breath appears to be obesity related and exercise induced. She has agreed to work on weight loss and gradually increase exercise to treat her exercise induced shortness of breath. Will continue to monitor closely.  3. Essential hypertension Saman is working on healthy weight loss and exercise to improve blood pressure control. We will watch for signs of hypotension as she continues her lifestyle modifications.  4. OSA (obstructive sleep apnea) Intensive lifestyle modifications are the first line treatment for this issue. We discussed several lifestyle modifications today and she  will continue to work on diet, exercise and weight loss efforts. We will continue to monitor. Orders and follow up as documented in patient record.   Counseling  Sleep apnea is a condition in which breathing pauses or becomes shallow during sleep. This happens over and over during the night. This disrupts your sleep and keeps your body from getting the rest that it needs, which can cause tiredness and lack of energy (fatigue) during the day.  Sleep apnea treatment: If you were given a device to open your airway while you sleep, USE IT!  Sleep hygiene:   Limit or avoid alcohol, caffeinated beverages, and cigarettes, especially close to bedtime.   Do not eat a large meal or eat spicy foods right before bedtime. This can lead to digestive discomfort that can make it hard for you to sleep.  Keep a sleep diary to help you and your health care provider figure out what could be causing your insomnia.  . Make your bedroom a dark, comfortable place where it is easy to fall asleep. ? Put  up shades or blackout curtains to block light from outside. ? Use a white noise machine to block noise. ? Keep the temperature cool. . Limit screen use before bedtime. This includes: ? Watching TV. ? Using your smartphone, tablet, or computer. . Stick to a routine that includes going to bed and waking up at the same times every day and night. This can help you fall asleep faster. Consider making a quiet activity, such as reading, part of your nighttime routine. . Try to avoid taking naps during the day so that you sleep better at night. . Get out of bed if you are still awake after 15 minutes of trying to sleep. Keep the lights down, but try reading or doing a quiet activity. When you feel sleepy, go back to bed.  5. Prediabetes Kamesha will continue to work on weight loss, exercise, and decreasing simple carbohydrates to help decrease the risk of diabetes.   6. Other hyperlipidemia Cardiovascular risk and specific lipid/LDL goals reviewed.  We discussed several lifestyle modifications today and Laasya will continue to work on diet, exercise and weight loss efforts. Orders and follow up as documented in patient record.   Counseling Intensive lifestyle modifications are the first line treatment for this issue. . Dietary changes: Increase soluble fiber. Decrease simple carbohydrates. . Exercise changes: Moderate to vigorous-intensity aerobic activity 150 minutes per week if tolerated. . Lipid-lowering medications: see documented in medical record.  7. Depression screening Allyah had a positive depression screening. Depression is commonly associated with obesity and often results in emotional eating behaviors. We will monitor this closely and work on CBT to help improve the non-hunger eating patterns. Referral to Psychology may be required if no improvement is seen as she continues in our clinic.  8. At risk for heart disease Eriana was given approximately 15 minutes of coronary  artery disease prevention counseling today. She is 49 y.o. female and has risk factors for heart disease including obesity. We discussed intensive lifestyle modifications today with an emphasis on specific weight loss instructions and strategies.   Repetitive spaced learning was employed today to elicit superior memory formation and behavioral change.  9. Class 3 severe obesity with serious comorbidity and body mass index (BMI) greater than or equal to 70 in adult, unspecified obesity type (HCC) Mosella is currently in the action stage of change and her goal is to continue with weight loss efforts. I recommend Ayauna  begin the structured treatment plan as follows:  She has agreed to the Category 4 Plan.  Exercise goals: As is.   Behavioral modification strategies: increasing lean protein intake, decreasing simple carbohydrates, increasing vegetables, increasing water intake and decreasing liquid calories.  She was informed of the importance of frequent follow-up visits to maximize her success with intensive lifestyle modifications for her multiple health conditions. She was informed we would discuss her lab results at her next visit unless there is a critical issue that needs to be addressed sooner. Donella agreed to keep her next visit at the agreed upon time to discuss these results.  Objective:   Blood pressure (!) 172/79, pulse 73, temperature 97.8 F (36.6 C), temperature source Oral, height 5\' 4"  (1.626 m), weight (!) 414 lb (187.8 kg), SpO2 98 %. Body mass index is 71.06 kg/m.  EKG: Normal sinus rhythm, rate 74 bpm.  Indirect Calorimeter completed today shows a VO2 of 310 and a REE of 2157.  Her calculated basal metabolic rate is [id number] thus her basal metabolic rate is worse than expected.  General: Cooperative, alert, well developed, in no acute distress. HEENT: Conjunctivae and lids unremarkable. Cardiovascular: Regular rhythm.  Lungs: Normal work of breathing. Neurologic:  No focal deficits.   Lab Results  Component Value Date   CREATININE 0.83 03/21/2019   BUN 14 03/21/2019   NA 141 03/21/2019   K 4.2 03/21/2019   CL 105 03/21/2019   CO2 28 03/21/2019   Lab Results  Component Value Date   ALT 12 03/21/2019   AST 9 03/21/2019   ALKPHOS 78 03/21/2019   BILITOT 0.3 03/21/2019   Lab Results  Component Value Date   HGBA1C 5.7 02/19/2016   HGBA1C 6.4 (H) 08/29/2014   Lab Results  Component Value Date   TSH 1.34 03/21/2019   Lab Results  Component Value Date   CHOL 176 03/21/2019   HDL 34.40 (L) 03/21/2019   LDLCALC 123 (H) 03/21/2019   TRIG 91.0 03/21/2019   CHOLHDL 5 03/21/2019   Lab Results  Component Value Date   WBC 11.0 (H) 03/21/2019   HGB 11.1 (L) 03/21/2019   HCT 35.3 (L) 03/21/2019   MCV 80.0 03/21/2019   PLT 506.0 (H) 03/21/2019   Lab Results  Component Value Date   IRON 30 (L) 05/04/2016   FERRITIN 34.5 05/04/2016   Attestation Statements:   This is the patient's first visit at [facility]. The patient's NEW PATIENT PACKET was reviewed at length. Included in the packet: current and past health history, medications, allergies, ROS, gynecologic history (women only), surgical history, family history, social history, weight history, weight loss surgery history (for those that have had weight loss surgery), nutritional evaluation, mood and food questionnaire, PHQ9, Epworth questionnaire, sleep habits questionnaire, patient life and health improvement goals questionnaire. These will all be scanned into the patient's chart under media.   During the visit, I independently reviewed the patient's EKG, bioimpedance scale results, and indirect calorimeter results. I used this information to tailor a meal plan for the patient that will help her to lose weight and will improve her obesity-related conditions going forward. I performed a medically necessary appropriate examination and/or evaluation. I discussed the assessment  and treatment plan with the patient. The patient was provided an opportunity to ask questions and all were answered. The patient agreed with the plan and demonstrated an understanding of the instructions. Labs were ordered at this visit and will be reviewed at the next visit  unless more critical results need to be addressed immediately. Clinical information was updated and documented in the EMR.   I, Water quality scientist, CMA, am acting as Location manager for [employer], DO.  I have reviewed the above documentation for accuracy and completeness, and I agree with the above. Briscoe Deutscher, DO

## 2019-09-19 NOTE — Telephone Encounter (Signed)
FYI

## 2019-09-21 ENCOUNTER — Encounter (INDEPENDENT_AMBULATORY_CARE_PROVIDER_SITE_OTHER): Payer: Self-pay | Admitting: *Deleted

## 2019-10-03 ENCOUNTER — Ambulatory Visit (INDEPENDENT_AMBULATORY_CARE_PROVIDER_SITE_OTHER): Payer: 59 | Admitting: Family Medicine

## 2019-10-03 ENCOUNTER — Encounter (INDEPENDENT_AMBULATORY_CARE_PROVIDER_SITE_OTHER): Payer: Self-pay | Admitting: Family Medicine

## 2019-10-03 ENCOUNTER — Other Ambulatory Visit: Payer: Self-pay

## 2019-10-03 VITALS — BP 127/80 | HR 67 | Temp 97.7°F | Ht 64.0 in | Wt >= 6400 oz

## 2019-10-03 DIAGNOSIS — Z6841 Body Mass Index (BMI) 40.0 and over, adult: Secondary | ICD-10-CM

## 2019-10-03 DIAGNOSIS — R5383 Other fatigue: Secondary | ICD-10-CM | POA: Diagnosis not present

## 2019-10-03 DIAGNOSIS — Z9189 Other specified personal risk factors, not elsewhere classified: Secondary | ICD-10-CM | POA: Diagnosis not present

## 2019-10-03 DIAGNOSIS — I10 Essential (primary) hypertension: Secondary | ICD-10-CM

## 2019-10-03 DIAGNOSIS — R7303 Prediabetes: Secondary | ICD-10-CM

## 2019-10-03 DIAGNOSIS — E785 Hyperlipidemia, unspecified: Secondary | ICD-10-CM

## 2019-10-03 NOTE — Progress Notes (Signed)
Chief Complaint:   OBESITY Angela Tucker is here to discuss her progress with her obesity treatment plan along with follow-up of her obesity related diagnoses. Angela Tucker is on the Category 4 Plan and states she is following her eating plan approximately 75% of the time. Angela Tucker states she is walking for 60 minutes 2 times per week and doing arm exercises for 15 minutes 2 times per week.  Today's visit was #: 2 Starting weight: 414 lbs Starting date: 09/19/2019 Today's weight: 413 lbs Today's date: 10/03/2019 Total lbs lost to date: 1 lb Total lbs lost since last in-office visit: 1 lb  Interim History: Angela Tucker is doing [employer] through work with the [employer].    She provided the following food recall: Breakfast:  2 bacon, 2 eggs (spray butter). Lunch:  Leftovers - chicken strips, coleslaw. Dinner:  Doing Free Talenti (raspberry sorbet).   Subjective:   1. Essential hypertension Review: taking medications as instructed, no medication side effects noted, no chest pain on exertion, no dyspnea on exertion, no swelling of ankles.   BP Readings from Last 3 Encounters:  10/03/19 127/80  09/19/19 (!) 172/79  03/21/19 (!) 151/59   2. Prediabetes Angela Tucker has a diagnosis of prediabetes based on her elevated HgA1c and was informed this puts her at greater risk of developing diabetes. She continues to work on diet and exercise to decrease her risk of diabetes. She denies nausea or hypoglycemia.  Lab Results  Component Value Date   HGBA1C 5.7 02/19/2016   3. Dyslipidemia Angela Tucker has hyperlipidemia and has been trying to improve her cholesterol levels with intensive lifestyle modification including a low saturated fat diet, exercise and weight loss. She denies any chest pain, claudication or myalgias.  Lab Results  Component Value Date   ALT 12 03/21/2019   AST 9 03/21/2019   ALKPHOS 78 03/21/2019   BILITOT 0.3 03/21/2019   Lab Results  Component Value Date   CHOL 187  08/16/2019   HDL 39 08/16/2019   LDLCALC 129 08/16/2019   TRIG 91 08/16/2019   CHOLHDL 5 03/21/2019   4. Other fatigue Angela Tucker endorses fatigue.  5. At risk for hyperglycemia Angela Tucker has a history of some elevated blood glucose readings without a diagnosis of diabetes. She denies polyphagia.  Assessment/Plan:   1. Essential hypertension Angela Tucker is working on healthy weight loss and exercise to improve blood pressure control. We will watch for signs of hypotension as she continues her lifestyle modifications.  Orders - CBC with Differential/Platelet  2. Prediabetes Angela Tucker will continue to work on weight loss, exercise, and decreasing simple carbohydrates to help decrease the risk of diabetes.   Orders - Comprehensive metabolic panel - Hemoglobin A1c - Insulin, random  3. Dyslipidemia Cardiovascular risk and specific lipid/LDL goals reviewed.  We discussed several lifestyle modifications today and Angela Tucker will continue to work on diet, exercise and weight loss efforts. Orders and follow up as documented in patient record.   Counseling Intensive lifestyle modifications are the first line treatment for this issue. . Dietary changes: Increase soluble fiber. Decrease simple carbohydrates. . Exercise changes: Moderate to vigorous-intensity aerobic activity 150 minutes per week if tolerated. . Lipid-lowering medications: see documented in medical record.  4. Other fatigue Will check labs today, as below.  Orders - VITAMIN D 25 Hydroxy (Vit-D Deficiency, Fractures) - TSH - T4, free - T3 - Anemia panel  5. At risk for hyperglycemia Angela Tucker was given approximately 15 minutes of counseling today regarding prevention of hyperglycemia.  She was advised of hyperglycemia causes and the fact hyperglycemia is often asymptomatic. Angela Tucker was instructed to avoid skipping meals, eat regular protein rich meals and schedule low calorie but protein rich snacks as needed.    Repetitive spaced learning was employed today to elicit superior memory formation and behavioral change  6. Class 3 severe obesity with serious comorbidity and body mass index (BMI) greater than or equal to 70 in adult, unspecified obesity type (HCC) Angela Tucker is currently in the action stage of change. As such, her goal is to continue with weight loss efforts. She has agreed to the Category 4 Plan.   Exercise goals: As is.  Behavioral modification strategies: increasing lean protein intake and increasing water intake.  Angela Tucker has agreed to follow-up with our clinic in 2 weeks. She was informed of the importance of frequent follow-up visits to maximize her success with intensive lifestyle modifications for her multiple health conditions.   Angela Tucker was informed we would discuss her lab results at her next visit unless there is a critical issue that needs to be addressed sooner. Angela Tucker agreed to keep her next visit at the agreed upon time to discuss these results.  Objective:   Blood pressure 127/80, pulse 67, temperature 97.7 F (36.5 C), temperature source Oral, height 5\' 4"  (1.626 m), weight (!) 413 lb (187.3 kg), SpO2 99 %. Body mass index is 70.89 kg/m.  General: Cooperative, alert, well developed, in no acute distress. HEENT: Conjunctivae and lids unremarkable. Cardiovascular: Regular rhythm.  Lungs: Normal work of breathing. Neurologic: No focal deficits.   Lab Results  Component Value Date   CREATININE 0.83 03/21/2019   BUN 14 03/21/2019   NA 141 03/21/2019   K 4.2 03/21/2019   CL 105 03/21/2019   CO2 28 03/21/2019   Lab Results  Component Value Date   ALT 12 03/21/2019   AST 9 03/21/2019   ALKPHOS 78 03/21/2019   BILITOT 0.3 03/21/2019   Lab Results  Component Value Date   HGBA1C 5.7 02/19/2016   HGBA1C 6.4 (H) 08/29/2014   Lab Results  Component Value Date   TSH 1.34 03/21/2019   Lab Results  Component Value Date   CHOL 187 08/16/2019   HDL 39  08/16/2019   LDLCALC 129 08/16/2019   TRIG 91 08/16/2019   CHOLHDL 5 03/21/2019   Lab Results  Component Value Date   WBC 11.0 (H) 03/21/2019   HGB 11.1 (L) 03/21/2019   HCT 35.3 (L) 03/21/2019   MCV 80.0 03/21/2019   PLT 506.0 (H) 03/21/2019   Lab Results  Component Value Date   IRON 30 (L) 05/04/2016   FERRITIN 34.5 05/04/2016   Attestation Statements:   Reviewed by clinician on day of visit: allergies, medications, problem list, medical history, surgical history, family history, social history, and previous encounter notes.  I, Water quality scientist, CMA, am acting as Location manager for [employer], DO.  I have reviewed the above documentation for accuracy and completeness, and I agree with the above. Briscoe Deutscher, DO

## 2019-10-04 LAB — CBC WITH DIFFERENTIAL/PLATELET
Basophils Absolute: 0.1 10*3/uL (ref 0.0–0.2)
Basos: 1 %
EOS (ABSOLUTE): 0.2 10*3/uL (ref 0.0–0.4)
Eos: 2 %
Hemoglobin: 12.3 g/dL (ref 11.1–15.9)
Immature Grans (Abs): 0 10*3/uL (ref 0.0–0.1)
Immature Granulocytes: 0 %
Lymphocytes Absolute: 4.3 10*3/uL — ABNORMAL HIGH (ref 0.7–3.1)
Lymphs: 44 %
MCH: 24.3 pg — ABNORMAL LOW (ref 26.6–33.0)
MCHC: 31.6 g/dL (ref 31.5–35.7)
MCV: 77 fL — ABNORMAL LOW (ref 79–97)
Monocytes Absolute: 0.4 10*3/uL (ref 0.1–0.9)
Monocytes: 4 %
Neutrophils Absolute: 4.6 10*3/uL (ref 1.4–7.0)
Neutrophils: 49 %
Platelets: 370 10*3/uL (ref 150–450)
RBC: 5.06 x10E6/uL (ref 3.77–5.28)
RDW: 16.6 % — ABNORMAL HIGH (ref 11.7–15.4)
WBC: 9.7 10*3/uL (ref 3.4–10.8)

## 2019-10-04 LAB — ANEMIA PANEL
Ferritin: 62 ng/mL (ref 15–150)
Folate, Hemolysate: 352 ng/mL
Folate, RBC: 905 ng/mL (ref >498)
Hematocrit: 38.9 % (ref 34.0–46.6)
Iron Saturation: 13 % — ABNORMAL LOW (ref 15–55)
Iron: 37 ug/dL (ref 27–159)
Retic Ct Pct: 1.2 % (ref 0.6–2.6)
Total Iron Binding Capacity: 285 ug/dL (ref 250–450)
UIBC: 248 ug/dL (ref 131–425)
Vitamin B-12: 624 pg/mL (ref 232–1245)

## 2019-10-04 LAB — T3: T3, Total: 137 ng/dL (ref 71–180)

## 2019-10-04 LAB — COMPREHENSIVE METABOLIC PANEL
ALT: 14 IU/L (ref 0–32)
AST: 14 IU/L (ref 0–40)
Albumin/Globulin Ratio: 1.3 (ref 1.2–2.2)
Albumin: 3.9 g/dL (ref 3.8–4.8)
Alkaline Phosphatase: 99 IU/L (ref 39–117)
BUN/Creatinine Ratio: 9 (ref 9–23)
BUN: 7 mg/dL (ref 6–24)
Bilirubin Total: 0.3 mg/dL (ref 0.0–1.2)
CO2: 23 mmol/L (ref 20–29)
Calcium: 9.4 mg/dL (ref 8.7–10.2)
Chloride: 103 mmol/L (ref 96–106)
Creatinine, Ser: 0.77 mg/dL (ref 0.57–1.00)
GFR calc Af Amer: 105 mL/min/{1.73_m2} (ref >59)
GFR calc non Af Amer: 91 mL/min/{1.73_m2} (ref >59)
Globulin, Total: 2.9 g/dL (ref 1.5–4.5)
Glucose: 95 mg/dL (ref 65–99)
Potassium: 4.5 mmol/L (ref 3.5–5.2)
Sodium: 141 mmol/L (ref 134–144)
Total Protein: 6.8 g/dL (ref 6.0–8.5)

## 2019-10-04 LAB — T4, FREE: Free T4: 1.11 ng/dL (ref 0.82–1.77)

## 2019-10-04 LAB — TSH: TSH: 1.52 u[IU]/mL (ref 0.450–4.500)

## 2019-10-04 LAB — HEMOGLOBIN A1C
Est. average glucose Bld gHb Est-mCnc: 120 mg/dL
Hgb A1c MFr Bld: 5.8 % — ABNORMAL HIGH (ref 4.8–5.6)

## 2019-10-04 LAB — INSULIN, RANDOM: INSULIN: 13.1 u[IU]/mL (ref 2.6–24.9)

## 2019-10-04 LAB — VITAMIN D 25 HYDROXY (VIT D DEFICIENCY, FRACTURES): Vit D, 25-Hydroxy: 21.4 ng/mL — ABNORMAL LOW (ref 30.0–100.0)

## 2019-10-18 ENCOUNTER — Ambulatory Visit (INDEPENDENT_AMBULATORY_CARE_PROVIDER_SITE_OTHER): Payer: 59 | Admitting: Family Medicine

## 2019-10-18 ENCOUNTER — Other Ambulatory Visit: Payer: Self-pay

## 2019-10-18 ENCOUNTER — Encounter (INDEPENDENT_AMBULATORY_CARE_PROVIDER_SITE_OTHER): Payer: Self-pay | Admitting: Family Medicine

## 2019-10-18 VITALS — BP 138/81 | HR 69 | Temp 97.8°F | Ht 64.0 in | Wt >= 6400 oz

## 2019-10-18 DIAGNOSIS — Z9189 Other specified personal risk factors, not elsewhere classified: Secondary | ICD-10-CM

## 2019-10-18 DIAGNOSIS — E559 Vitamin D deficiency, unspecified: Secondary | ICD-10-CM

## 2019-10-18 DIAGNOSIS — Z6841 Body Mass Index (BMI) 40.0 and over, adult: Secondary | ICD-10-CM

## 2019-10-18 DIAGNOSIS — R7303 Prediabetes: Secondary | ICD-10-CM

## 2019-10-18 MED ORDER — VITAMIN D (ERGOCALCIFEROL) 1.25 MG (50000 UNIT) PO CAPS: 50000.0000 [IU] | ORAL_CAPSULE | ORAL | 0 refills | Status: DC

## 2019-10-18 MED ORDER — METFORMIN HCL 500 MG PO TABS: 500.0000 mg | ORAL_TABLET | Freq: Two times a day (BID) | ORAL | 0 refills | Status: DC

## 2019-10-18 NOTE — Progress Notes (Signed)
Chief Complaint:   OBESITY Angela Tucker is here to discuss her progress with her obesity treatment plan along with follow-up of her obesity related diagnoses. Angela Tucker is on the Category 4 Plan and states she is following her eating plan approximately 90% of the time. Angela Tucker states she is walking for 30-40 minutes 3 times per week.  Today's visit was #: 3 Starting weight: 414 lbs Starting date: 09/19/2019 Today's weight: 414 lbs Today's date: 10/18/2019 Total lbs lost to date: 0 Total lbs lost since last in-office visit: 0  Interim History: Angela Tucker is doing [employer] at work.  She says that she enjoyed Easter. She has maintained since our last visit.   Subjective:   1. Prediabetes Angela Tucker has a diagnosis of prediabetes based on her elevated HgA1c and was informed this puts her at greater risk of developing diabetes. She continues to work on diet and exercise to decrease her risk of diabetes. She denies nausea or hypoglycemia.  Lab Results  Component Value Date   HGBA1C 5.8 (H) 10/03/2019   Lab Results  Component Value Date   INSULIN 13.1 10/03/2019   2. Vitamin D deficiency Angela Tucker's Vitamin D level was 21.4 on 10/03/2019. She is currently taking no vitamin D supplement. She denies nausea, vomiting or muscle weakness.  Assessment/Plan:   1. Prediabetes Angela Tucker will continue to work on weight loss, exercise, and decreasing simple carbohydrates to help decrease the risk of diabetes.  Will trial metformin for 6 weeks, as below.  Orders - metFORMIN (GLUCOPHAGE) 500 MG tablet; Take 1 tablet (500 mg total) by mouth 2 (two) times daily.  Dispense: 60 tablet; Refill: 0  2. Vitamin D deficiency Low Vitamin D level contributes to fatigue and are associated with obesity, breast, and colon cancer. She agrees to start to take prescription Vitamin D @50 ,000 IU every week and will follow-up for routine testing of Vitamin D, at least 2-3 times per year to avoid  over-replacement.  Orders - Vitamin D, Ergocalciferol, (DRISDOL) 1.25 MG (50000 UNIT) CAPS capsule; Take 1 capsule (50,000 Units total) by mouth every 7 (seven) days.  Dispense: 4 capsule; Refill: 0  3. At risk for diabetes mellitus Angela Tucker was given approximately 15 minutes of diabetes education and counseling today. We discussed intensive lifestyle modifications today with an emphasis on weight loss as well as increasing exercise and decreasing simple carbohydrates in her diet. We also reviewed medication options with an emphasis on risk versus benefit of those discussed.   Repetitive spaced learning was employed today to elicit superior memory formation and behavioral change.  4. Class 3 severe obesity with serious comorbidity and body mass index (BMI) greater than or equal to 70 in adult, unspecified obesity type (HCC) Angela Tucker is currently in the action stage of change. As such, her goal is to continue with weight loss efforts. She has agreed to the Category 4 Plan.   Exercise goals: For substantial health benefits, adults should do at least 150 minutes (2 hours and 30 minutes) a week of moderate-intensity, or 75 minutes (1 hour and 15 minutes) a week of vigorous-intensity aerobic physical activity, or an equivalent combination of moderate- and vigorous-intensity aerobic activity. Aerobic activity should be performed in episodes of at least 10 minutes, and preferably, it should be spread throughout the week.  Behavioral modification strategies: increasing water intake.  Angela Tucker has agreed to follow-up with our clinic in 2 weeks. She was informed of the importance of frequent follow-up visits to maximize her success with intensive  lifestyle modifications for her multiple health conditions.   Objective:   Blood pressure 138/81, pulse 69, temperature 97.8 F (36.6 C), temperature source Oral, height 5\' 4"  (1.626 m), weight (!) 414 lb (187.8 kg), SpO2 99 %. Body mass index is 71.06  kg/m.  General: Cooperative, alert, well developed, in no acute distress. HEENT: Conjunctivae and lids unremarkable. Cardiovascular: Regular rhythm.  Lungs: Normal work of breathing. Neurologic: No focal deficits.   Lab Results  Component Value Date   CREATININE 0.77 10/03/2019   BUN 7 10/03/2019   NA 141 10/03/2019   K 4.5 10/03/2019   CL 103 10/03/2019   CO2 23 10/03/2019   Lab Results  Component Value Date   ALT 14 10/03/2019   AST 14 10/03/2019   ALKPHOS 99 10/03/2019   BILITOT 0.3 10/03/2019   Lab Results  Component Value Date   HGBA1C 5.8 (H) 10/03/2019   HGBA1C 5.7 02/19/2016   HGBA1C 6.4 (H) 08/29/2014   Lab Results  Component Value Date   INSULIN 13.1 10/03/2019   Lab Results  Component Value Date   TSH 1.520 10/03/2019   Lab Results  Component Value Date   CHOL 187 08/16/2019   HDL 39 08/16/2019   LDLCALC 129 08/16/2019   TRIG 91 08/16/2019   CHOLHDL 5 03/21/2019   Lab Results  Component Value Date   WBC 9.7 10/03/2019   HGB 12.3 10/03/2019   HCT 38.9 10/03/2019   MCV 77 (L) 10/03/2019   PLT 370 10/03/2019   Lab Results  Component Value Date   IRON 37 10/03/2019   TIBC 285 10/03/2019   FERRITIN 62 10/03/2019   Attestation Statements:   Reviewed by clinician on day of visit: allergies, medications, problem list, medical history, surgical history, family history, social history, and previous encounter notes.  I, Water quality scientist, CMA, am acting as Location manager for [employer], DO.  I have reviewed the above documentation for accuracy and completeness, and I agree with the above. Briscoe Deutscher, DO

## 2019-11-13 ENCOUNTER — Ambulatory Visit (INDEPENDENT_AMBULATORY_CARE_PROVIDER_SITE_OTHER): Payer: 59 | Admitting: Family Medicine

## 2019-11-27 ENCOUNTER — Other Ambulatory Visit: Payer: Self-pay

## 2019-11-27 ENCOUNTER — Encounter (INDEPENDENT_AMBULATORY_CARE_PROVIDER_SITE_OTHER): Payer: Self-pay | Admitting: Family Medicine

## 2019-11-27 ENCOUNTER — Ambulatory Visit (INDEPENDENT_AMBULATORY_CARE_PROVIDER_SITE_OTHER): Payer: 59 | Admitting: Family Medicine

## 2019-11-27 VITALS — BP 120/78 | HR 66 | Temp 97.6°F | Ht 64.0 in | Wt >= 6400 oz

## 2019-11-27 DIAGNOSIS — Z6841 Body Mass Index (BMI) 40.0 and over, adult: Secondary | ICD-10-CM

## 2019-11-27 DIAGNOSIS — R7303 Prediabetes: Secondary | ICD-10-CM | POA: Diagnosis not present

## 2019-11-27 DIAGNOSIS — I10 Essential (primary) hypertension: Secondary | ICD-10-CM | POA: Diagnosis not present

## 2019-11-27 DIAGNOSIS — E559 Vitamin D deficiency, unspecified: Secondary | ICD-10-CM

## 2019-11-27 DIAGNOSIS — Z9189 Other specified personal risk factors, not elsewhere classified: Secondary | ICD-10-CM | POA: Diagnosis not present

## 2019-11-27 MED ORDER — TRULICITY 0.75 MG/0.5ML ~~LOC~~ SOAJ: 0.7500 mg | SUBCUTANEOUS | 0 refills | Status: DC

## 2019-11-27 NOTE — Progress Notes (Signed)
Chief Complaint:   OBESITY Angela Tucker is here to discuss her progress with her obesity treatment plan along with follow-up of her obesity related diagnoses. Angela Tucker is on the Category 4 Plan and states she is following her eating plan approximately 50% of the time. Angela Tucker states she is exercising for 0 minutes 0 times per week.  Today's visit was #: 4 Starting weight: 414 lbs Starting date: 09/19/2019 Today's weight: 413 lbs Today's date: 11/27/2019 Total lbs lost to date: 1 lbs Total lbs lost since last in-office visit: 1 lb  Interim History: Angela Tucker is doing [employer] at work.  She does not have a kitchen right now, so she is using the microwave only.  She is hoping her kitchen will be finished within the next few weeks.  She has a poor sleep schedule.  She has decreased her water intake.  Highest weight was 423 lbs.  Subjective:   1. Vitamin D deficiency Angela Tucker's Vitamin D level was 21.4 on 10/03/2019. She is currently taking prescription vitamin D 50,000 IU each week. She denies nausea, vomiting or muscle weakness.  2. Prediabetes Angela Tucker has a diagnosis of prediabetes based on her elevated HgA1c and was informed this puts her at greater risk of developing diabetes. She continues to work on diet and exercise to decrease her risk of diabetes. She denies nausea or hypoglycemia.  Metformin causes GI issues.  Lab Results  Component Value Date   HGBA1C 5.8 (H) 10/03/2019   Lab Results  Component Value Date   INSULIN 13.1 10/03/2019   3. Essential hypertension Review: taking medications as instructed, no medication side effects noted, no chest pain on exertion, no dyspnea on exertion, no swelling of ankles.  Blood pressure looks great today!  BP Readings from Last 3 Encounters:  11/27/19 120/78  10/18/19 138/81  10/03/19 127/80   Assessment/Plan:   1. Vitamin D deficiency Low Vitamin D level contributes to fatigue and are associated with obesity, breast, and  colon cancer. She agrees to continue to take prescription Vitamin D @50 ,000 IU every week and will follow-up for routine testing of Vitamin D, at least 2-3 times per year to avoid over-replacement.  2. Prediabetes Angela Tucker will continue to work on weight loss, exercise, and decreasing simple carbohydrates to help decrease the risk of diabetes.   Orders - Dulaglutide (TRULICITY) [id number] [id number] SOPN; Inject 0.75 mg into the skin once a week.  Dispense: 4 pen; Refill: 0  3. Essential hypertension Angela Tucker is working on healthy weight loss and exercise to improve blood pressure control. We will watch for signs of hypotension as she continues her lifestyle modifications.  4. At risk for deficient intake of food Angela Tucker was given approximately 15 minutes of deficit intake of food prevention counseling today. Angela Tucker is at risk for eating too few calories based on current food recall. She was encouraged to focus on meeting caloric and protein goals according to her recommended meal plan.   5. Class 3 severe obesity with serious comorbidity and body mass index (BMI) greater than or equal to 70 in adult, unspecified obesity type (HCC) Angela Tucker is currently in the action stage of change. As such, her goal is to continue with weight loss efforts. She has agreed to the Category 4 Plan.   Exercise goals: For substantial health benefits, adults should do at least 150 minutes (2 hours and 30 minutes) a week of moderate-intensity, or 75 minutes (1 hour and 15 minutes) a week of vigorous-intensity aerobic physical activity, or  an equivalent combination of moderate- and vigorous-intensity aerobic activity. Aerobic activity should be performed in episodes of at least 10 minutes, and preferably, it should be spread throughout the week.  Behavioral modification strategies: increasing lean protein intake and increasing water intake.  Angela Tucker has agreed to follow-up with our clinic in 2 weeks. She was informed  of the importance of frequent follow-up visits to maximize her success with intensive lifestyle modifications for her multiple health conditions.   Objective:   Blood pressure 120/78, pulse 66, temperature 97.6 F (36.4 C), height 5\' 4"  (1.626 m), weight (!) 413 lb (187.3 kg), SpO2 97 %. Body mass index is 70.89 kg/m.  General: Cooperative, alert, well developed, in no acute distress. HEENT: Conjunctivae and lids unremarkable. Cardiovascular: Regular rhythm.  Lungs: Normal work of breathing. Neurologic: No focal deficits.   Lab Results  Component Value Date   CREATININE 0.77 10/03/2019   BUN 7 10/03/2019   NA 141 10/03/2019   K 4.5 10/03/2019   CL 103 10/03/2019   CO2 23 10/03/2019   Lab Results  Component Value Date   ALT 14 10/03/2019   AST 14 10/03/2019   ALKPHOS 99 10/03/2019   BILITOT 0.3 10/03/2019   Lab Results  Component Value Date   HGBA1C 5.8 (H) 10/03/2019   HGBA1C 5.7 02/19/2016   HGBA1C 6.4 (H) 08/29/2014   Lab Results  Component Value Date   INSULIN 13.1 10/03/2019   Lab Results  Component Value Date   TSH 1.520 10/03/2019   Lab Results  Component Value Date   CHOL 187 08/16/2019   HDL 39 08/16/2019   LDLCALC 129 08/16/2019   TRIG 91 08/16/2019   CHOLHDL 5 03/21/2019   Lab Results  Component Value Date   WBC 9.7 10/03/2019   HGB 12.3 10/03/2019   HCT 38.9 10/03/2019   MCV 77 (L) 10/03/2019   PLT 370 10/03/2019   Lab Results  Component Value Date   IRON 37 10/03/2019   TIBC 285 10/03/2019   FERRITIN 62 10/03/2019   Attestation Statements:   Reviewed by clinician on day of visit: allergies, medications, problem list, medical history, surgical history, family history, social history, and previous encounter notes.  I, Water quality scientist, CMA, am acting as Location manager for [employer], DO.  I have reviewed the above documentation for accuracy and completeness, and I agree with the above. Briscoe Deutscher, DO

## 2019-12-25 ENCOUNTER — Ambulatory Visit (INDEPENDENT_AMBULATORY_CARE_PROVIDER_SITE_OTHER): Payer: 59 | Admitting: Family Medicine

## 2019-12-25 ENCOUNTER — Encounter (INDEPENDENT_AMBULATORY_CARE_PROVIDER_SITE_OTHER): Payer: Self-pay | Admitting: Family Medicine

## 2019-12-25 ENCOUNTER — Other Ambulatory Visit: Payer: Self-pay

## 2019-12-25 VITALS — BP 126/79 | HR 66 | Temp 97.8°F | Ht 64.0 in | Wt >= 6400 oz

## 2019-12-25 DIAGNOSIS — Z6841 Body Mass Index (BMI) 40.0 and over, adult: Secondary | ICD-10-CM

## 2019-12-25 DIAGNOSIS — E559 Vitamin D deficiency, unspecified: Secondary | ICD-10-CM | POA: Diagnosis not present

## 2019-12-25 DIAGNOSIS — R7303 Prediabetes: Secondary | ICD-10-CM

## 2019-12-25 DIAGNOSIS — Z9189 Other specified personal risk factors, not elsewhere classified: Secondary | ICD-10-CM | POA: Diagnosis not present

## 2019-12-25 DIAGNOSIS — I10 Essential (primary) hypertension: Secondary | ICD-10-CM | POA: Diagnosis not present

## 2019-12-25 MED ORDER — VITAMIN D (ERGOCALCIFEROL) 1.25 MG (50000 UNIT) PO CAPS: 50000.0000 [IU] | ORAL_CAPSULE | ORAL | 0 refills | Status: DC

## 2019-12-25 NOTE — Progress Notes (Signed)
Chief Complaint:   OBESITY Angela Tucker is here to discuss her progress with her obesity treatment plan along with follow-up of her obesity related diagnoses. Angela Tucker is on the Category 4 Plan and states she is following her eating plan approximately 0% of the time. Angela Tucker states she is exercising for 0 minutes 0 times per week.  Today's visit was #: 5 Starting weight: 414 lbs Starting date: 09/19/2019 Today's weight: 409 lbs Today's date: 12/25/2019 Total lbs lost to date: 5 lbs Total lbs lost since last in-office visit: 4 lbs  Interim History: Angela Tucker says that she joined a gym but she has not gone yet.  She says she was experiencing increased lower extremity edema, but she increased her water intake and her movement and her edema improved. She has been having increased stress, and says that this will be the case for a few more months.  She says that she did not have a stove until yesterday.  Subjective:   1. Prediabetes Angela Tucker has a diagnosis of prediabetes based on her elevated HgA1c and was informed this puts her at greater risk of developing diabetes. She continues to work on diet and exercise to decrease her risk of diabetes. She denies nausea or hypoglycemia.  She is taking metformin 500 mg twice daily.  Lab Results  Component Value Date   HGBA1C 5.8 (H) 10/03/2019   Lab Results  Component Value Date   INSULIN 13.1 10/03/2019   2. Essential hypertension Review: taking medications as instructed, no medication side effects noted, no chest pain on exertion, no dyspnea on exertion, no swelling of ankles.  She takes metoprolol for blood pressure control.  BP Readings from Last 3 Encounters:  12/25/19 126/79  11/27/19 120/78  10/18/19 138/81   3. Vitamin D deficiency Angela Tucker's Vitamin D level was 21.4 on 10/03/2019. She is currently taking prescription vitamin D 50,000 IU each week. She denies nausea, vomiting or muscle weakness.  4. At risk for heart  disease Angela Tucker is at a higher than average risk for cardiovascular disease due to obesity.   Assessment/Plan:   1. Prediabetes Angela Tucker will continue to work on weight loss, exercise, and decreasing simple carbohydrates to help decrease the risk of diabetes.   Orders - metFORMIN (GLUCOPHAGE) 500 MG tablet; Take 1 tablet (500 mg total) by mouth 2 (two) times daily.  Dispense: 60 tablet; Refill: 0  2. Essential hypertension Angela Tucker is working on healthy weight loss and exercise to improve blood pressure control. We will watch for signs of hypotension as she continues her lifestyle modifications.  3. Vitamin D deficiency Low Vitamin D level contributes to fatigue and are associated with obesity, breast, and colon cancer. She agrees to continue to take prescription Vitamin D @50 ,000 IU every week and will follow-up for routine testing of Vitamin D, at least 2-3 times per year to avoid over-replacement.  Orders - Vitamin D, Ergocalciferol, (DRISDOL) 1.25 MG (50000 UNIT) CAPS capsule; Take 1 capsule (50,000 Units total) by mouth every 7 (seven) days.  Dispense: 4 capsule; Refill: 0  4. At risk for heart disease Angela Tucker was given approximately 15 minutes of coronary artery disease prevention counseling today. She is 49 y.o. female and has risk factors for heart disease including obesity. We discussed intensive lifestyle modifications today with an emphasis on specific weight loss instructions and strategies.   Repetitive spaced learning was employed today to elicit superior memory formation and behavioral change.  5. Class 3 severe obesity with serious comorbidity and body  mass index (BMI) greater than or equal to 70 in adult, unspecified obesity type (Angela Tucker) Angela Tucker is currently in the action stage of change. As such, her goal is to continue with weight loss efforts. She has agreed to the Category 4 Plan.   Exercise goals: No exercise has been prescribed at this time.  Behavioral  modification strategies: increasing lean protein intake and increasing water intake.  Angela Tucker has agreed to follow-up with our clinic in 3 weeks. She was informed of the importance of frequent follow-up visits to maximize her success with intensive lifestyle modifications for her multiple health conditions.   Objective:   Blood pressure 126/79, pulse 66, temperature 97.8 F (36.6 C), temperature source Oral, height 5\' 4"  (1.626 m), weight (!) 409 lb (185.5 kg), SpO2 96 %. Body mass index is 70.2 kg/m.  General: Cooperative, alert, well developed, in no acute distress. HEENT: Conjunctivae and lids unremarkable. Cardiovascular: Regular rhythm.  Lungs: Normal work of breathing. Neurologic: No focal deficits.   Lab Results  Component Value Date   CREATININE 0.77 10/03/2019   BUN 7 10/03/2019   NA 141 10/03/2019   K 4.5 10/03/2019   CL 103 10/03/2019   CO2 23 10/03/2019   Lab Results  Component Value Date   ALT 14 10/03/2019   AST 14 10/03/2019   ALKPHOS 99 10/03/2019   BILITOT 0.3 10/03/2019   Lab Results  Component Value Date   HGBA1C 5.8 (H) 10/03/2019   HGBA1C 5.7 02/19/2016   HGBA1C 6.4 (H) 08/29/2014   Lab Results  Component Value Date   INSULIN 13.1 10/03/2019   Lab Results  Component Value Date   TSH 1.520 10/03/2019   Lab Results  Component Value Date   CHOL 187 08/16/2019   HDL 39 08/16/2019   LDLCALC 129 08/16/2019   TRIG 91 08/16/2019   CHOLHDL 5 03/21/2019   Lab Results  Component Value Date   WBC 9.7 10/03/2019   HGB 12.3 10/03/2019   HCT 38.9 10/03/2019   MCV 77 (L) 10/03/2019   PLT 370 10/03/2019   Lab Results  Component Value Date   IRON 37 10/03/2019   TIBC 285 10/03/2019   FERRITIN 62 10/03/2019   Attestation Statements:   Reviewed by clinician on day of visit: allergies, medications, problem list, medical history, surgical history, family history, social history, and previous encounter notes.  I, Water quality scientist, CMA, am acting as  transcriptionist for Briscoe Deutscher, DO  I have reviewed the above documentation for accuracy and completeness, and I agree with the above. Briscoe Deutscher, DO

## 2019-12-26 MED ORDER — METFORMIN HCL 500 MG PO TABS: 500.0000 mg | ORAL_TABLET | Freq: Two times a day (BID) | ORAL | 0 refills | Status: DC

## 2020-01-17 ENCOUNTER — Ambulatory Visit (INDEPENDENT_AMBULATORY_CARE_PROVIDER_SITE_OTHER): Payer: 59 | Admitting: Family Medicine

## 2020-01-17 ENCOUNTER — Encounter (INDEPENDENT_AMBULATORY_CARE_PROVIDER_SITE_OTHER): Payer: Self-pay | Admitting: Family Medicine

## 2020-01-17 ENCOUNTER — Other Ambulatory Visit: Payer: Self-pay

## 2020-01-17 VITALS — BP 130/72 | HR 67 | Temp 97.8°F | Ht 64.0 in | Wt >= 6400 oz

## 2020-01-17 DIAGNOSIS — E559 Vitamin D deficiency, unspecified: Secondary | ICD-10-CM

## 2020-01-17 DIAGNOSIS — R7303 Prediabetes: Secondary | ICD-10-CM | POA: Diagnosis not present

## 2020-01-17 DIAGNOSIS — Z6841 Body Mass Index (BMI) 40.0 and over, adult: Secondary | ICD-10-CM

## 2020-01-17 DIAGNOSIS — Z9189 Other specified personal risk factors, not elsewhere classified: Secondary | ICD-10-CM | POA: Diagnosis not present

## 2020-01-17 MED ORDER — SEMAGLUTIDE(0.25 OR 0.5MG/DOS) 2 MG/1.5ML ~~LOC~~ SOPN: 0.5000 mg | PEN_INJECTOR | SUBCUTANEOUS | 0 refills | Status: AC

## 2020-01-18 MED ORDER — INSULIN PEN NEEDLE 32G X 4 MM MISC: 1.0000 | 0 refills | Status: DC

## 2020-01-18 NOTE — Progress Notes (Signed)
Chief Complaint:   OBESITY Angela Tucker is here to discuss her progress with her obesity treatment plan along with follow-up of her obesity related diagnoses. Angela Tucker is on the Category 4 Plan and states she is following her eating plan approximately 0% of the time. Angela Tucker states she is walking for 20 minutes 3 times per week.  Today's visit was #: 6 Starting weight: 414 lbs Starting date: 09/19/2019 Today's weight: 416 lbs Today's date: 01/17/2020 Total lbs lost to date: 0 Total lbs lost since last in-office visit: 0  Interim History: Angela Tucker has been doing a lot of celebration eating over the pat 2 weeks.  Subjective:   1. Prediabetes Angela Tucker has a diagnosis of prediabetes based on her elevated HgA1c and was informed this puts her at greater risk of developing diabetes. She continues to work on diet and exercise to decrease her risk of diabetes. She denies nausea or hypoglycemia.  Ozempic was not approved.  She is taking metformin.  Lab Results  Component Value Date   HGBA1C 5.8 (H) 10/03/2019   Lab Results  Component Value Date   INSULIN 13.1 10/03/2019   2. Vitamin D deficiency Angela Tucker's Vitamin D level was 21.4 on 10/03/2019. She is currently taking prescription vitamin D 50,000 IU each week. She denies nausea, vomiting or muscle weakness.  Assessment/Plan:   1. Prediabetes Angela Tucker will continue to work on weight loss, exercise, and decreasing simple carbohydrates to help decrease the risk of diabetes.   2. Vitamin D deficiency Low Vitamin D level contributes to fatigue and are associated with obesity, breast, and colon cancer. She agrees to continue to take prescription Vitamin D @50 ,000 IU every week and will follow-up for routine testing of Vitamin D, at least 2-3 times per year to avoid over-replacement.  3. At risk for heart disease Angela Tucker was given approximately 15 minutes of coronary artery disease prevention counseling today. She is 49 y.o. female  and has risk factors for heart disease including obesity. We discussed intensive lifestyle modifications today with an emphasis on specific weight loss instructions and strategies.   Repetitive spaced learning was employed today to elicit superior memory formation and behavioral change.  4. Class 3 severe obesity with serious comorbidity and body mass index (BMI) greater than or equal to 70 in adult, unspecified obesity type (HCC) Angela Tucker is currently in the action stage of change. As such, her goal is to continue with weight loss efforts. She has agreed to the Category 4 Plan.   Exercise goals: For substantial health benefits, adults should do at least 150 minutes (2 hours and 30 minutes) a week of moderate-intensity, or 75 minutes (1 hour and 15 minutes) a week of vigorous-intensity aerobic physical activity, or an equivalent combination of moderate- and vigorous-intensity aerobic activity. Aerobic activity should be performed in episodes of at least 10 minutes, and preferably, it should be spread throughout the week.  Behavioral modification strategies: increasing lean protein intake, emotional eating strategies, holiday eating strategies  and celebration eating strategies.  Angela Tucker has agreed to follow-up with our clinic in 3-4 weeks. She was informed of the importance of frequent follow-up visits to maximize her success with intensive lifestyle modifications for her multiple health conditions.   Meds ordered this encounter  Medications  . Semaglutide,0.25 or 0.5MG /DOS, 2 MG/1.5ML SOPN    Sig: Inject 0.375 mLs (0.5 mg total) into the skin once a week for 4 doses.    Dispense:  4 pen    Refill:  0  Please dispense brand name Wegovy, not to confuse with other semaglutide-containing products  . Insulin Pen Needle 32G X 4 MM MISC    Sig: 1 each by Does not apply route once a week.    Dispense:  50 each    Refill:  0  . metFORMIN (GLUCOPHAGE) 500 MG tablet    Sig: Take 1 tablet (500 mg  total) by mouth 2 (two) times daily.    Dispense:  60 tablet    Refill:  0   Objective:   Blood pressure 130/72, pulse 67, temperature 97.8 F (36.6 C), temperature source Oral, height 5\' 4"  (1.626 m), weight (!) 416 lb (188.7 kg), SpO2 98 %. Body mass index is 71.41 kg/m.  General: Cooperative, alert, well developed, in no acute distress. HEENT: Conjunctivae and lids unremarkable. Cardiovascular: Regular rhythm.  Lungs: Normal work of breathing. Neurologic: No focal deficits.   Lab Results  Component Value Date   CREATININE 0.77 10/03/2019   BUN 7 10/03/2019   NA 141 10/03/2019   K 4.5 10/03/2019   CL 103 10/03/2019   CO2 23 10/03/2019   Lab Results  Component Value Date   ALT 14 10/03/2019   AST 14 10/03/2019   ALKPHOS 99 10/03/2019   BILITOT 0.3 10/03/2019   Lab Results  Component Value Date   HGBA1C 5.8 (H) 10/03/2019   HGBA1C 5.7 02/19/2016   HGBA1C 6.4 (H) 08/29/2014   Lab Results  Component Value Date   INSULIN 13.1 10/03/2019   Lab Results  Component Value Date   TSH 1.520 10/03/2019   Lab Results  Component Value Date   CHOL 187 08/16/2019   HDL 39 08/16/2019   LDLCALC 129 08/16/2019   TRIG 91 08/16/2019   CHOLHDL 5 03/21/2019   Lab Results  Component Value Date   WBC 9.7 10/03/2019   HGB 12.3 10/03/2019   HCT 38.9 10/03/2019   MCV 77 (L) 10/03/2019   PLT 370 10/03/2019   Lab Results  Component Value Date   IRON 37 10/03/2019   TIBC 285 10/03/2019   FERRITIN 62 10/03/2019   Attestation Statements:   Reviewed by clinician on day of visit: allergies, medications, problem list, medical history, surgical history, family history, social history, and previous encounter notes.  I, Water quality scientist, CMA, am acting as transcriptionist for Briscoe Deutscher, DO  I have reviewed the above documentation for accuracy and completeness, and I agree with the above. Briscoe Deutscher, DO

## 2020-01-24 ENCOUNTER — Other Ambulatory Visit (INDEPENDENT_AMBULATORY_CARE_PROVIDER_SITE_OTHER): Payer: Self-pay | Admitting: Family Medicine

## 2020-01-24 DIAGNOSIS — R7303 Prediabetes: Secondary | ICD-10-CM

## 2020-01-25 MED ORDER — METFORMIN HCL 500 MG PO TABS: 500.0000 mg | ORAL_TABLET | Freq: Two times a day (BID) | ORAL | 0 refills | Status: DC

## 2020-02-14 ENCOUNTER — Ambulatory Visit (INDEPENDENT_AMBULATORY_CARE_PROVIDER_SITE_OTHER): Payer: 59 | Admitting: Family Medicine

## 2020-02-26 ENCOUNTER — Ambulatory Visit (INDEPENDENT_AMBULATORY_CARE_PROVIDER_SITE_OTHER): Payer: 59 | Admitting: Adult Health

## 2020-03-02 ENCOUNTER — Other Ambulatory Visit (INDEPENDENT_AMBULATORY_CARE_PROVIDER_SITE_OTHER): Payer: Self-pay | Admitting: Family Medicine

## 2020-03-02 DIAGNOSIS — R7303 Prediabetes: Secondary | ICD-10-CM

## 2020-03-04 ENCOUNTER — Encounter (INDEPENDENT_AMBULATORY_CARE_PROVIDER_SITE_OTHER): Payer: Self-pay

## 2020-03-04 NOTE — Telephone Encounter (Signed)
My chart message sent to pt.

## 2020-03-21 ENCOUNTER — Ambulatory Visit (INDEPENDENT_AMBULATORY_CARE_PROVIDER_SITE_OTHER): Payer: 59 | Admitting: Family Medicine

## 2020-03-21 ENCOUNTER — Encounter (INDEPENDENT_AMBULATORY_CARE_PROVIDER_SITE_OTHER): Payer: Self-pay | Admitting: Family Medicine

## 2020-03-21 ENCOUNTER — Other Ambulatory Visit: Payer: Self-pay

## 2020-03-21 VITALS — BP 110/76 | HR 68 | Temp 98.0°F | Ht 64.0 in | Wt >= 6400 oz

## 2020-03-21 DIAGNOSIS — R7303 Prediabetes: Secondary | ICD-10-CM

## 2020-03-21 DIAGNOSIS — F3289 Other specified depressive episodes: Secondary | ICD-10-CM

## 2020-03-21 DIAGNOSIS — Z9189 Other specified personal risk factors, not elsewhere classified: Secondary | ICD-10-CM | POA: Diagnosis not present

## 2020-03-21 DIAGNOSIS — E8881 Metabolic syndrome: Secondary | ICD-10-CM

## 2020-03-21 DIAGNOSIS — E65 Localized adiposity: Secondary | ICD-10-CM | POA: Diagnosis not present

## 2020-03-21 DIAGNOSIS — E559 Vitamin D deficiency, unspecified: Secondary | ICD-10-CM

## 2020-03-21 DIAGNOSIS — Z6841 Body Mass Index (BMI) 40.0 and over, adult: Secondary | ICD-10-CM

## 2020-03-21 DIAGNOSIS — Z79899 Other long term (current) drug therapy: Secondary | ICD-10-CM

## 2020-03-22 LAB — CBC WITH DIFFERENTIAL/PLATELET
Basophils Absolute: 0.1 10*3/uL (ref 0.0–0.2)
Basos: 1 %
EOS (ABSOLUTE): 0.2 10*3/uL (ref 0.0–0.4)
Eos: 2 %
Hematocrit: 39.3 % (ref 34.0–46.6)
Hemoglobin: 12 g/dL (ref 11.1–15.9)
Immature Grans (Abs): 0 10*3/uL (ref 0.0–0.1)
Immature Granulocytes: 0 %
Lymphocytes Absolute: 3.9 10*3/uL — ABNORMAL HIGH (ref 0.7–3.1)
Lymphs: 40 %
MCH: 24.3 pg — ABNORMAL LOW (ref 26.6–33.0)
MCHC: 30.5 g/dL — ABNORMAL LOW (ref 31.5–35.7)
MCV: 80 fL (ref 79–97)
Monocytes Absolute: 0.4 10*3/uL (ref 0.1–0.9)
Monocytes: 4 %
Neutrophils Absolute: 5.1 10*3/uL (ref 1.4–7.0)
Neutrophils: 53 %
Platelets: 385 10*3/uL (ref 150–450)
RBC: 4.94 x10E6/uL (ref 3.77–5.28)
RDW: 15.8 % — ABNORMAL HIGH (ref 11.7–15.4)
WBC: 9.7 10*3/uL (ref 3.4–10.8)

## 2020-03-22 LAB — COMPREHENSIVE METABOLIC PANEL
ALT: 8 IU/L (ref 0–32)
AST: 8 IU/L (ref 0–40)
Albumin/Globulin Ratio: 1.2 (ref 1.2–2.2)
Albumin: 3.6 g/dL — ABNORMAL LOW (ref 3.8–4.8)
Alkaline Phosphatase: 92 IU/L (ref 48–121)
BUN/Creatinine Ratio: 12 (ref 9–23)
BUN: 9 mg/dL (ref 6–24)
Bilirubin Total: 0.3 mg/dL (ref 0.0–1.2)
CO2: 26 mmol/L (ref 20–29)
Calcium: 9.1 mg/dL (ref 8.7–10.2)
Chloride: 102 mmol/L (ref 96–106)
Creatinine, Ser: 0.77 mg/dL (ref 0.57–1.00)
GFR calc Af Amer: 105 mL/min/{1.73_m2} (ref >59)
GFR calc non Af Amer: 91 mL/min/{1.73_m2} (ref >59)
Globulin, Total: 2.9 g/dL (ref 1.5–4.5)
Glucose: 99 mg/dL (ref 65–99)
Potassium: 4.5 mmol/L (ref 3.5–5.2)
Sodium: 140 mmol/L (ref 134–144)
Total Protein: 6.5 g/dL (ref 6.0–8.5)

## 2020-03-22 LAB — LIPID PANEL
Chol/HDL Ratio: 4.4 ratio (ref 0.0–4.4)
Cholesterol, Total: 170 mg/dL (ref 100–199)
HDL: 39 mg/dL — ABNORMAL LOW (ref >39)
LDL Chol Calc (NIH): 117 mg/dL — ABNORMAL HIGH (ref 0–99)
Triglycerides: 76 mg/dL (ref 0–149)
VLDL Cholesterol Cal: 14 mg/dL (ref 5–40)

## 2020-03-22 LAB — HEMOGLOBIN A1C
Est. average glucose Bld gHb Est-mCnc: 120 mg/dL
Hgb A1c MFr Bld: 5.8 % — ABNORMAL HIGH (ref 4.8–5.6)

## 2020-03-22 LAB — VITAMIN B12: Vitamin B-12: 446 pg/mL (ref 232–1245)

## 2020-03-22 LAB — VITAMIN D 25 HYDROXY (VIT D DEFICIENCY, FRACTURES): Vit D, 25-Hydroxy: 17.7 ng/mL — ABNORMAL LOW (ref 30.0–100.0)

## 2020-03-26 NOTE — Progress Notes (Signed)
Chief Complaint:   OBESITY Angela Tucker is here to discuss her progress with her obesity treatment plan along with follow-up of her obesity related diagnoses. Angela Tucker is on the Category 4 Plan and states she is following her eating plan approximately 0% of the time. Angela Tucker states she is exercising for 0 minutes 0 times per week.  Today's visit was #: 7 Starting weight: 414 lbs Starting date: 09/19/2019 Today's weight: 412 lbs Today's date: 03/21/2020 Total lbs lost to date: 2 lbs Total lbs lost since last in-office visit: 4 lbs Total weight loss percentage to date: -0.48%  Interim History: Angela Tucker is going to her other group once a month now.  She report that she needs more accountability.  She has started drinking sodas again.  Assessment/Plan:   1. Metabolic syndrome Goal: Lose 7-10% of starting weight. Counseling: Intensive lifestyle modifications are the first line treatment for this issue. We discussed several lifestyle modifications today and she will continue to work on diet, exercise and weight loss efforts.   - Comprehensive metabolic panel - Lipid panel  2. Visceral obesity Visceral adipose tissue is a hormonally active component of total body fat. This body composition phenotype is associated with medical disorders such as metabolic syndrome, cardiovascular disease and several malignancies including prostate, breast, and colorectal cancers. Goal: Lose 7-10% of starting weight. Visceral fat rating should be < 13.  3. Prediabetes Angela Tucker will continue to work on weight loss, exercise, and decreasing simple carbohydrates to help decrease the risk of diabetes.  Will check A1c today.  Lab Results  Component Value Date   HGBA1C 5.8 (H) 03/21/2020   Lab Results  Component Value Date   INSULIN 13.1 10/03/2019   - Hemoglobin A1c  4. Vitamin D deficiency Current vitamin D is 17.7, tested on 03/21/2020. Not at goal. Optimal goal > 50 ng/dL. There is also evidence to  support a goal of >70 ng/dL in patients with cancer and heart disease. Plan: Continue Vitamin D @50 ,000 IU every week with follow-up for routine testing of Vitamin D at least 2-3 times per year to avoid over-replacement.  - VITAMIN D 25 Hydroxy (Vit-D Deficiency, Fractures)  5. Medication management Will check vitamin B12 level today.  - CBC with Differential/Platelet - Comprehensive metabolic panel - Vitamin Y24  6. 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    7. At risk for deficient intake of food The patient is at a higher than average risk of deficient intake of food.  8. Class 3 severe obesity with serious comorbidity and body mass index (BMI) greater than or equal to 70 in adult, unspecified obesity type (HCC) Remmie is currently in the action stage of change. As such, her goal is to continue with weight loss efforts. She has agreed to the Category 3 Plan.   Exercise goals: For substantial health benefits, adults should do at least 150 minutes (2 hours and 30 minutes) a week of moderate-intensity, or 75 minutes (1 hour and 15 minutes) a week of vigorous-intensity aerobic physical activity, or an equivalent combination of moderate- and vigorous-intensity aerobic activity. Aerobic activity should be performed in episodes of at least 10 minutes, and preferably, it should be spread throughout the week.  Behavioral modification strategies: increasing lean protein intake (Aim for 30 grams of protein at each meal) and keeping a strict food journal.  Angela Tucker has agreed to follow-up with our clinic in 2-3 weeks. She was informed of the importance of frequent follow-up visits to maximize  her success with intensive lifestyle modifications for her multiple health conditions.   Angela Tucker was informed we would discuss her lab results at her next visit unless there is a critical issue that needs to be addressed sooner. Angela Tucker agreed to keep her next visit at the  agreed upon time to discuss these results.  Objective:   Blood pressure 110/76, pulse 68, temperature 98 F (36.7 C), temperature source Oral, height 5\' 4"  (1.626 m), weight (!) 412 lb (186.9 kg), SpO2 97 %. Body mass index is 70.72 kg/m.  General: Cooperative, alert, well developed, in no acute distress. HEENT: Conjunctivae and lids unremarkable. Cardiovascular: Regular rhythm.  Lungs: Normal work of breathing. Neurologic: No focal deficits.   Lab Results  Component Value Date   CREATININE 0.77 03/21/2020   BUN 9 03/21/2020   NA 140 03/21/2020   K 4.5 03/21/2020   CL 102 03/21/2020   CO2 26 03/21/2020   Lab Results  Component Value Date   ALT 8 03/21/2020   AST 8 03/21/2020   ALKPHOS 92 03/21/2020   BILITOT 0.3 03/21/2020   Lab Results  Component Value Date   HGBA1C 5.8 (H) 03/21/2020   HGBA1C 5.8 (H) 10/03/2019   HGBA1C 5.7 02/19/2016   HGBA1C 6.4 (H) 08/29/2014   Lab Results  Component Value Date   INSULIN 13.1 10/03/2019   Lab Results  Component Value Date   TSH 1.520 10/03/2019   Lab Results  Component Value Date   CHOL 170 03/21/2020   HDL 39 (L) 03/21/2020   LDLCALC 117 (H) 03/21/2020   TRIG 76 03/21/2020   CHOLHDL 4.4 03/21/2020   Lab Results  Component Value Date   WBC 9.7 03/21/2020   HGB 12.0 03/21/2020   HCT 39.3 03/21/2020   MCV 80 03/21/2020   PLT 385 03/21/2020   Lab Results  Component Value Date   IRON 37 10/03/2019   TIBC 285 10/03/2019   FERRITIN 62 10/03/2019   Attestation Statements:   Reviewed by clinician on day of visit: allergies, medications, problem list, medical history, surgical history, family history, social history, and previous encounter notes.  I, Water quality scientist, CMA, am acting as transcriptionist for Briscoe Deutscher, DO  I have reviewed the above documentation for accuracy and completeness, and I agree with the above. Briscoe Deutscher, DO

## 2020-04-08 ENCOUNTER — Ambulatory Visit (INDEPENDENT_AMBULATORY_CARE_PROVIDER_SITE_OTHER): Payer: 59 | Admitting: Family Medicine

## 2020-04-08 ENCOUNTER — Other Ambulatory Visit: Payer: Self-pay

## 2020-04-08 ENCOUNTER — Other Ambulatory Visit (INDEPENDENT_AMBULATORY_CARE_PROVIDER_SITE_OTHER): Payer: Self-pay | Admitting: Family Medicine

## 2020-04-08 VITALS — BP 135/79 | HR 69 | Temp 97.8°F | Ht 64.0 in | Wt >= 6400 oz

## 2020-04-08 DIAGNOSIS — E8881 Metabolic syndrome: Secondary | ICD-10-CM | POA: Diagnosis not present

## 2020-04-08 DIAGNOSIS — Z9189 Other specified personal risk factors, not elsewhere classified: Secondary | ICD-10-CM

## 2020-04-08 DIAGNOSIS — R7303 Prediabetes: Secondary | ICD-10-CM

## 2020-04-08 DIAGNOSIS — E559 Vitamin D deficiency, unspecified: Secondary | ICD-10-CM | POA: Diagnosis not present

## 2020-04-08 DIAGNOSIS — R1013 Epigastric pain: Secondary | ICD-10-CM | POA: Diagnosis not present

## 2020-04-08 DIAGNOSIS — Z6841 Body Mass Index (BMI) 40.0 and over, adult: Secondary | ICD-10-CM

## 2020-04-08 LAB — CBC WITH DIFFERENTIAL/PLATELET
Basophils Absolute: 0.1 10*3/uL (ref 0.0–0.2)
Basos: 1 %
EOS (ABSOLUTE): 0.4 10*3/uL (ref 0.0–0.4)
Eos: 4 %
Hematocrit: 37.3 % (ref 34.0–46.6)
Hemoglobin: 12.1 g/dL (ref 11.1–15.9)
Lymphocytes Absolute: 3.7 10*3/uL — ABNORMAL HIGH (ref 0.7–3.1)
Lymphs: 38 %
MCH: 24.6 pg — ABNORMAL LOW (ref 26.6–33.0)
MCHC: 32.4 g/dL (ref 31.5–35.7)
MCV: 76 fL — ABNORMAL LOW (ref 79–97)
Monocytes Absolute: 0.6 10*3/uL (ref 0.1–0.9)
Monocytes: 7 %
Neutrophils Absolute: 5 10*3/uL (ref 1.4–7.0)
Neutrophils: 50 %
Platelets: 434 10*3/uL (ref 150–450)
RBC: 4.91 x10E6/uL (ref 3.77–5.28)
RDW: 17.2 % — ABNORMAL HIGH (ref 11.7–15.4)
WBC: 9.7 10*3/uL (ref 3.4–10.8)

## 2020-04-08 LAB — COMPREHENSIVE METABOLIC PANEL
ALT: 11 IU/L (ref 0–32)
AST: 11 IU/L (ref 0–40)
Albumin/Globulin Ratio: 1.4 (ref 1.2–2.2)
Albumin: 3.9 g/dL (ref 3.8–4.8)
Alkaline Phosphatase: 101 IU/L (ref 44–121)
BUN/Creatinine Ratio: 10 (ref 9–23)
BUN: 8 mg/dL (ref 6–24)
Bilirubin Total: 0.3 mg/dL (ref 0.0–1.2)
CO2: 30 mmol/L — ABNORMAL HIGH (ref 20–29)
Calcium: 9.9 mg/dL (ref 8.7–10.2)
Chloride: 103 mmol/L (ref 96–106)
Creatinine, Ser: 0.78 mg/dL (ref 0.57–1.00)
GFR calc Af Amer: 103 mL/min/{1.73_m2} (ref >59)
GFR calc non Af Amer: 90 mL/min/{1.73_m2} (ref >59)
Globulin, Total: 2.7 g/dL (ref 1.5–4.5)
Glucose: 111 mg/dL — ABNORMAL HIGH (ref 65–99)
Potassium: 4.5 mmol/L (ref 3.5–5.2)
Sodium: 142 mmol/L (ref 134–144)
Total Protein: 6.6 g/dL (ref 6.0–8.5)

## 2020-04-08 LAB — LIPASE: Lipase: 18 U/L (ref 14–72)

## 2020-04-08 MED ORDER — VITAMIN D (ERGOCALCIFEROL) 1.25 MG (50000 UNIT) PO CAPS: 50000.0000 [IU] | ORAL_CAPSULE | ORAL | 0 refills | Status: DC

## 2020-04-08 MED ORDER — ESOMEPRAZOLE MAGNESIUM 40 MG PO CPDR: 40.0000 mg | DELAYED_RELEASE_CAPSULE | Freq: Every day | ORAL | 3 refills | Status: DC

## 2020-04-08 MED ORDER — FAMOTIDINE 20 MG PO TABS: 20.0000 mg | ORAL_TABLET | Freq: Two times a day (BID) | ORAL | 0 refills | Status: DC

## 2020-04-09 NOTE — Progress Notes (Signed)
Chief Complaint:   OBESITY Angela Tucker is here to discuss her progress with her obesity treatment plan along with follow-up of her obesity related diagnoses. Angela Tucker is on the Category 4 Plan and states she is following her eating plan approximately 50% of the time. Angela Tucker states she is walking for 20 minutes 3 times per week.  Today's visit was #: 8 Starting weight: 414 lbs Starting date: 09/19/2019 Today's weight: 408 lbs Today's date: 04/08/2020 Total lbs lost to date: 6 lbs Total lbs lost since last in-office visit: 4 lbs Total weight loss percentage to date: -1.45%  Interim History: Angela Tucker had a few episodes of nausea and vomiting last week.  Food indiscretion caused epigastric pain.  Assessment/Plan:   1. Vitamin D deficiency Current vitamin D is 17.7, tested on 03/21/2020. Not at goal. Optimal goal > 50 ng/dL. There is also evidence to support a goal of >70 ng/dL in patients with cancer and heart disease. Plan: Continue Vitamin D @50 ,000 IU every week with follow-up for routine testing of Vitamin D at least 2-3 times per year to avoid over-replacement.  - Refill Vitamin D, Ergocalciferol, (DRISDOL) 1.25 MG (50000 UNIT) CAPS capsule; Take 1 capsule (50,000 Units total) by mouth every 7 (seven) days.  Dispense: 12 capsule; Refill: 0  2. Prediabetes Improving, but not optimized. Goal is HgbA1c < 5.7 and insulin level closer to 5. We will continue to monitor.  Continue metformin.  Lab Results  Component Value Date   HGBA1C 5.8 (H) 03/21/2020   Lab Results  Component Value Date   INSULIN 13.1 10/03/2019   3. Metabolic syndrome With increased visceral fat.  Goal: Lose 7-10% of starting weight. We discussed several lifestyle modifications today and she will continue to work on diet, exercise and weight loss efforts.   4. Epigastric pain Will start Nexium 40 mg daily. I encouraged Angela Tucker to follow up with her PCP if not improving.   - CBC with  Differential/Platelet - Comprehensive metabolic panel - Lipase - esomeprazole (NEXIUM) 40 MG capsule; Take 1 capsule (40 mg total) by mouth daily.  Dispense: 30 capsule; Refill: 3 - famotidine (PEPCID) 20 MG tablet; Take 1 tablet (20 mg total) by mouth 2 (two) times daily.  Dispense: 60 tablet; Refill: 0  5. At risk for deficient intake of food Angela Tucker was given approximately 15 minutes of deficit intake of food prevention counseling today. Angela Tucker is at risk for eating too few calories based on current food recall and recent issues with epigastric pain. We reviewed okay to eat bland, soft foods.   6. Class 3 severe obesity with serious comorbidity and body mass index (BMI) greater than or equal to 70 in adult, unspecified obesity type (HCC)  Angela Tucker is currently in the action stage of change. As such, her goal is to continue with weight loss efforts. She has agreed to bland foods, liquid/soft, as tolerated until epigastric pain improves.   Exercise goals: As tolerated.  Behavioral modification strategies: increasing water intake.  Angela Tucker has agreed to follow-up with our clinic in 2-3 weeks. She was informed of the importance of frequent follow-up visits to maximize her success with intensive lifestyle modifications for her multiple health conditions.   Objective:   Blood pressure 135/79, pulse 69, temperature 97.8 F (36.6 C), temperature source Oral, height 5\' 4"  (1.626 m), weight (!) 408 lb (185.1 kg), SpO2 99 %. Body mass index is 70.03 kg/m.  General: Cooperative, alert, well developed, in no acute distress. HEENT: Conjunctivae and  lids unremarkable. Cardiovascular: Regular rhythm.  Lungs: Normal work of breathing. Neurologic: No focal deficits.   Lab Results  Component Value Date   CREATININE 0.77 03/21/2020   BUN 9 03/21/2020   NA 140 03/21/2020   K 4.5 03/21/2020   CL 102 03/21/2020   CO2 26 03/21/2020   Lab Results  Component Value Date   ALT 8 03/21/2020    AST 8 03/21/2020   ALKPHOS 92 03/21/2020   BILITOT 0.3 03/21/2020   Lab Results  Component Value Date   HGBA1C 5.8 (H) 03/21/2020   HGBA1C 5.8 (H) 10/03/2019   HGBA1C 5.7 02/19/2016   HGBA1C 6.4 (H) 08/29/2014   Lab Results  Component Value Date   INSULIN 13.1 10/03/2019   Lab Results  Component Value Date   TSH 1.520 10/03/2019   Lab Results  Component Value Date   CHOL 170 03/21/2020   HDL 39 (L) 03/21/2020   LDLCALC 117 (H) 03/21/2020   TRIG 76 03/21/2020   CHOLHDL 4.4 03/21/2020   Lab Results  Component Value Date   WBC 9.7 03/21/2020   HGB 12.0 03/21/2020   HCT 39.3 03/21/2020   MCV 80 03/21/2020   PLT 385 03/21/2020   Lab Results  Component Value Date   IRON 37 10/03/2019   TIBC 285 10/03/2019   FERRITIN 62 10/03/2019   Attestation Statements:   Reviewed by clinician on day of visit: allergies, medications, problem list, medical history, surgical history, family history, social history, and previous encounter notes.  I, Water quality scientist, CMA, am acting as transcriptionist for Briscoe Deutscher, DO  I have reviewed the above documentation for accuracy and completeness, and I agree with the above. Briscoe Deutscher, DO

## 2020-04-10 ENCOUNTER — Encounter (INDEPENDENT_AMBULATORY_CARE_PROVIDER_SITE_OTHER): Payer: Self-pay | Admitting: Family Medicine

## 2020-04-22 ENCOUNTER — Encounter (INDEPENDENT_AMBULATORY_CARE_PROVIDER_SITE_OTHER): Payer: Self-pay | Admitting: Family Medicine

## 2020-04-22 ENCOUNTER — Ambulatory Visit (INDEPENDENT_AMBULATORY_CARE_PROVIDER_SITE_OTHER): Payer: 59 | Admitting: Family Medicine

## 2020-04-22 ENCOUNTER — Other Ambulatory Visit: Payer: Self-pay

## 2020-04-22 VITALS — BP 116/76 | HR 68 | Temp 97.9°F | Ht 64.0 in | Wt >= 6400 oz

## 2020-04-22 DIAGNOSIS — R7303 Prediabetes: Secondary | ICD-10-CM

## 2020-04-22 DIAGNOSIS — R5383 Other fatigue: Secondary | ICD-10-CM

## 2020-04-22 DIAGNOSIS — Z6841 Body Mass Index (BMI) 40.0 and over, adult: Secondary | ICD-10-CM

## 2020-04-22 MED ORDER — BUPROPION HCL ER (SR) 150 MG PO TB12: 150.0000 mg | ORAL_TABLET | Freq: Every day | ORAL | 0 refills | Status: DC

## 2020-04-22 MED ORDER — METFORMIN HCL ER 750 MG PO TB24: 750.0000 mg | ORAL_TABLET | Freq: Two times a day (BID) | ORAL | 0 refills | Status: DC

## 2020-04-23 NOTE — Progress Notes (Signed)
Chief Complaint:   OBESITY Angela Tucker is here to discuss her progress with her obesity treatment plan along with follow-up of her obesity related diagnoses. Angela Tucker is on the Category 4 Plan and states she is following her eating plan approximately 60% of the time. Angela Tucker states she is walking and strength training for 20 minutes 2-3 times per week.  Today's visit was #: 9 Starting weight: 414 lbs Starting date: 09/19/2019 Today's weight: 411 lbs Today's date: 04/22/2020 Total lbs lost to date: 3 lbs Total lbs lost since last in-office visit: +3 lbs Total weight loss percentage to date: -1.45%  Interim History: Angela Tucker says she would like to see more scale movement.  Saxenda and Trulicity were denied by her insurance.  Assessment/Plan:   1. Prediabetes Not at goal. Goal is HgbA1c < 5.7 and insulin level closer to 5.  Will increase metformin to 750 XL mg twice daily. She will continue to focus on protein-rich, low simple carbohydrate foods. We reviewed the importance of hydration, regular exercise for stress reduction, and restorative sleep.   Lab Results  Component Value Date   HGBA1C 5.8 (H) 03/21/2020   Lab Results  Component Value Date   INSULIN 13.1 10/03/2019   - Increase metFORMIN (GLUCOPHAGE XR) 750 MG 24 hr tablet; Take 1 tablet (750 mg total) by mouth in the morning and at bedtime.  Dispense: 60 tablet; Refill: 0  2. Other fatigue Angela Tucker says she has been more fatigued than usual. This seems to be related to lack of motivation. After discussion, patient would like to start below medication. Expectations, risks, and potential side effects reviewed.   - Start buPROPion (WELLBUTRIN SR) 150 MG 12 hr tablet; Take 1 tablet (150 mg total) by mouth daily.  Dispense: 30 tablet; Refill: 0  3. Class 3 severe obesity with serious comorbidity and body mass index (BMI) greater than or equal to 70 in adult, unspecified obesity type (HCC)  Angela Tucker is currently in the  action stage of change. As such, her goal is to continue with weight loss efforts. She has agreed to the Category 3 Plan.   Exercise goals: For substantial health benefits, adults should do at least 150 minutes (2 hours and 30 minutes) a week of moderate-intensity, or 75 minutes (1 hour and 15 minutes) a week of vigorous-intensity aerobic physical activity, or an equivalent combination of moderate- and vigorous-intensity aerobic activity. Aerobic activity should be performed in episodes of at least 10 minutes, and preferably, it should be spread throughout the week.  Behavioral modification strategies: increasing lean protein intake, decreasing simple carbohydrates and increasing vegetables.  Angela Tucker has agreed to follow-up with our clinic in 3 weeks. She was informed of the importance of frequent follow-up visits to maximize her success with intensive lifestyle modifications for her multiple health conditions.   Objective:   Blood pressure 116/76, pulse 68, temperature 97.9 F (36.6 C), temperature source Oral, height 5\' 4"  (1.626 m), weight (!) 411 lb (186.4 kg), SpO2 97 %. Body mass index is 70.55 kg/m.  General: Cooperative, alert, well developed, in no acute distress. HEENT: Conjunctivae and lids unremarkable. Cardiovascular: Regular rhythm.  Lungs: Normal work of breathing. Neurologic: No focal deficits.   Lab Results  Component Value Date   CREATININE 0.78 04/08/2020   BUN 8 04/08/2020   NA 142 04/08/2020   K 4.5 04/08/2020   CL 103 04/08/2020   CO2 30 (H) 04/08/2020   Lab Results  Component Value Date   ALT 11 04/08/2020  AST 11 04/08/2020   ALKPHOS 101 04/08/2020   BILITOT 0.3 04/08/2020   Lab Results  Component Value Date   HGBA1C 5.8 (H) 03/21/2020   HGBA1C 5.8 (H) 10/03/2019   HGBA1C 5.7 02/19/2016   HGBA1C 6.4 (H) 08/29/2014   Lab Results  Component Value Date   INSULIN 13.1 10/03/2019   Lab Results  Component Value Date   TSH 1.520 10/03/2019   Lab  Results  Component Value Date   CHOL 170 03/21/2020   HDL 39 (L) 03/21/2020   LDLCALC 117 (H) 03/21/2020   TRIG 76 03/21/2020   CHOLHDL 4.4 03/21/2020   Lab Results  Component Value Date   WBC 9.7 04/08/2020   HGB 12.1 04/08/2020   HCT 37.3 04/08/2020   MCV 76 (L) 04/08/2020   PLT 434 04/08/2020   Lab Results  Component Value Date   IRON 37 10/03/2019   TIBC 285 10/03/2019   FERRITIN 62 10/03/2019   Attestation Statements:   Reviewed by clinician on day of visit: allergies, medications, problem list, medical history, surgical history, family history, social history, and previous encounter notes.  I, Water quality scientist, CMA, am acting as transcriptionist for Briscoe Deutscher, DO  I have reviewed the above documentation for accuracy and completeness, and I agree with the above. Briscoe Deutscher, DO

## 2020-05-13 ENCOUNTER — Other Ambulatory Visit: Payer: Self-pay

## 2020-05-13 ENCOUNTER — Encounter (INDEPENDENT_AMBULATORY_CARE_PROVIDER_SITE_OTHER): Payer: Self-pay | Admitting: Family Medicine

## 2020-05-13 ENCOUNTER — Ambulatory Visit (INDEPENDENT_AMBULATORY_CARE_PROVIDER_SITE_OTHER): Payer: 59 | Admitting: Family Medicine

## 2020-05-13 VITALS — BP 104/69 | HR 77 | Temp 97.8°F | Ht 64.0 in | Wt >= 6400 oz

## 2020-05-13 DIAGNOSIS — Z9189 Other specified personal risk factors, not elsewhere classified: Secondary | ICD-10-CM

## 2020-05-13 DIAGNOSIS — I10 Essential (primary) hypertension: Secondary | ICD-10-CM | POA: Diagnosis not present

## 2020-05-13 DIAGNOSIS — E65 Localized adiposity: Secondary | ICD-10-CM

## 2020-05-13 DIAGNOSIS — Z6841 Body Mass Index (BMI) 40.0 and over, adult: Secondary | ICD-10-CM

## 2020-05-13 DIAGNOSIS — R7303 Prediabetes: Secondary | ICD-10-CM

## 2020-05-13 MED ORDER — BUPROPION HCL ER (SR) 150 MG PO TB12: 150.0000 mg | ORAL_TABLET | Freq: Every day | ORAL | 0 refills | Status: DC

## 2020-05-14 NOTE — Progress Notes (Signed)
Chief Complaint:   OBESITY Angela Tucker is here to discuss her progress with her obesity treatment plan along with follow-up of her obesity related diagnoses.   Today's visit was #: 10 Starting weight: 414 lbs Starting date: 09/19/2019 Today's weight: 412 lbs Today's date: 05/13/2020 Total lbs lost to date: 2 lbs Body mass index is 70.72 kg/m.  Total weight loss percentage to date: -0.48%  Interim History: Angela Tucker celebrated her birthday last week.  She says she likes the Wellbutrin; reports that she has less fatigue and more motivation.  Her goal is to be at less than 400 pounds by the end of the year.  Her fat mass has gone from 275.2 pounds to 270.2 pounds and muscle mass from 128.8 to 135.0 since her last visit.  Nutrition Plan: the Category 4 Plan for 50% of the time.  Anti-obesity medications: Wellbutrin, Metformin. Reported side effects: None. Hunger is moderately controlled controlled. Cravings are moderately controlled controlled.  Activity: Walking for 20 minutes for 5 times per week. Sleep: Sleep is restful.   Assessment/Plan:   1. Essential hypertension At goal.  No dizziness or orthostatic symptoms.  Medications: metoprolol 50 mg daily. Plan: Avoid buying foods that are: processed, frozen, or prepackaged to avoid excess salt. We will continue to monitor symptoms as they relate to her weight loss journey.  BP Readings from Last 3 Encounters:  05/13/20 104/69  04/22/20 116/76  04/08/20 135/79   Lab Results  Component Value Date   CREATININE 0.78 04/08/2020   2. Prediabetes Not at goal. Goal is HgbA1c < 5.7 and insulin level closer to 5. Medication: Metformin. The current medical regimen is effective;  continue present plan and medications.  Lab Results  Component Value Date   HGBA1C 5.8 (H) 03/21/2020   Lab Results  Component Value Date   INSULIN 13.1 10/03/2019   3. Visceral obesity Current visceral fat rating: 33. Visceral fat rating should be < 13.  Visceral adipose tissue is a hormonally active component of total body fat. This body composition phenotype is associated with medical disorders such as metabolic syndrome, cardiovascular disease and several malignancies including prostate, breast, and colorectal cancers. Starting goal: Lose 7-10% of starting weight.   4. At risk for complication associated with hypotension Angela Tucker was given approximately 10 minutes of education and counseling today to help avoid hypotension. We discussed risks of hypotension with weight loss and signs of hypotension such as feeling lightheaded or unsteady.  Discussed decreasing metoprolol by 1/2 tablet if needed.   5. Class 3 severe obesity with serious comorbidity and body mass index (BMI) greater than or equal to 70 in adult, unspecified obesity type (HCC)  - Refill buPROPion (WELLBUTRIN SR) 150 MG 12 hr tablet; Take 1 tablet (150 mg total) by mouth daily.  Dispense: 30 tablet; Refill: 0  Angela Tucker is currently in the action stage of change. As such, her goal is to continue with weight loss efforts.   Nutrition goals: She has agreed to the Category 4 Plan.   Exercise goals: For substantial health benefits, adults should do at least 150 minutes (2 hours and 30 minutes) a week of moderate-intensity, or 75 minutes (1 hour and 15 minutes) a week of vigorous-intensity aerobic physical activity, or an equivalent combination of moderate- and vigorous-intensity aerobic activity. Aerobic activity should be performed in episodes of at least 10 minutes, and preferably, it should be spread throughout the week.  Behavioral modification strategies: increasing lean protein intake, decreasing simple carbohydrates, increasing vegetables  and increasing water intake.  Angela Tucker has agreed to follow-up with our clinic in 3-4 weeks. She was informed of the importance of frequent follow-up visits to maximize her success with intensive lifestyle modifications for her multiple health  conditions.   Objective:   Blood pressure 104/69, pulse 77, temperature 97.8 F (36.6 C), temperature source Oral, height 5\' 4"  (1.626 m), weight (!) 412 lb (186.9 kg), SpO2 98 %. Body mass index is 70.72 kg/m.  General: Cooperative, alert, well developed, in no acute distress. HEENT: Conjunctivae and lids unremarkable. Cardiovascular: Regular rhythm.  Lungs: Normal work of breathing. Neurologic: No focal deficits.   Lab Results  Component Value Date   CREATININE 0.78 04/08/2020   BUN 8 04/08/2020   NA 142 04/08/2020   K 4.5 04/08/2020   CL 103 04/08/2020   CO2 30 (H) 04/08/2020   Lab Results  Component Value Date   ALT 11 04/08/2020   AST 11 04/08/2020   ALKPHOS 101 04/08/2020   BILITOT 0.3 04/08/2020   Lab Results  Component Value Date   HGBA1C 5.8 (H) 03/21/2020   HGBA1C 5.8 (H) 10/03/2019   HGBA1C 5.7 02/19/2016   HGBA1C 6.4 (H) 08/29/2014   Lab Results  Component Value Date   INSULIN 13.1 10/03/2019   Lab Results  Component Value Date   TSH 1.520 10/03/2019   Lab Results  Component Value Date   CHOL 170 03/21/2020   HDL 39 (L) 03/21/2020   LDLCALC 117 (H) 03/21/2020   TRIG 76 03/21/2020   CHOLHDL 4.4 03/21/2020   Lab Results  Component Value Date   WBC 9.7 04/08/2020   HGB 12.1 04/08/2020   HCT 37.3 04/08/2020   MCV 76 (L) 04/08/2020   PLT 434 04/08/2020   Lab Results  Component Value Date   IRON 37 10/03/2019   TIBC 285 10/03/2019   FERRITIN 62 10/03/2019   Attestation Statements:   Reviewed by clinician on day of visit: allergies, medications, problem list, medical history, surgical history, family history, social history, and previous encounter notes.  I, Water quality scientist, CMA, am acting as transcriptionist for Briscoe Deutscher, DO  I have reviewed the above documentation for accuracy and completeness, and I agree with the above. Briscoe Deutscher, DO

## 2020-05-25 ENCOUNTER — Other Ambulatory Visit (INDEPENDENT_AMBULATORY_CARE_PROVIDER_SITE_OTHER): Payer: Self-pay | Admitting: Family Medicine

## 2020-05-25 DIAGNOSIS — R7303 Prediabetes: Secondary | ICD-10-CM

## 2020-05-27 NOTE — Telephone Encounter (Signed)
Dr Wallace pt °

## 2020-05-28 NOTE — Telephone Encounter (Signed)
Is it okay to refill the Metformin?

## 2020-06-11 ENCOUNTER — Ambulatory Visit (INDEPENDENT_AMBULATORY_CARE_PROVIDER_SITE_OTHER): Payer: 59 | Admitting: Family Medicine

## 2020-06-25 ENCOUNTER — Other Ambulatory Visit: Payer: Self-pay

## 2020-06-25 ENCOUNTER — Ambulatory Visit (INDEPENDENT_AMBULATORY_CARE_PROVIDER_SITE_OTHER): Payer: 59 | Admitting: Family Medicine

## 2020-06-25 VITALS — BP 141/79 | HR 65 | Temp 97.7°F | Ht 64.0 in | Wt >= 6400 oz

## 2020-06-25 DIAGNOSIS — Z9189 Other specified personal risk factors, not elsewhere classified: Secondary | ICD-10-CM | POA: Diagnosis not present

## 2020-06-25 DIAGNOSIS — F3289 Other specified depressive episodes: Secondary | ICD-10-CM

## 2020-06-25 DIAGNOSIS — E559 Vitamin D deficiency, unspecified: Secondary | ICD-10-CM

## 2020-06-25 DIAGNOSIS — R7303 Prediabetes: Secondary | ICD-10-CM | POA: Diagnosis not present

## 2020-06-25 DIAGNOSIS — Z6841 Body Mass Index (BMI) 40.0 and over, adult: Secondary | ICD-10-CM

## 2020-06-25 MED ORDER — METFORMIN HCL ER 750 MG PO TB24
750.0000 mg | ORAL_TABLET | Freq: Two times a day (BID) | ORAL | 0 refills | Status: DC
Start: 2020-06-25 — End: 2021-01-28

## 2020-06-25 MED ORDER — VITAMIN D (ERGOCALCIFEROL) 1.25 MG (50000 UNIT) PO CAPS: 50000.0000 [IU] | ORAL_CAPSULE | ORAL | 0 refills | Status: DC

## 2020-06-25 MED ORDER — BUPROPION HCL ER (SR) 150 MG PO TB12: 150.0000 mg | ORAL_TABLET | Freq: Every day | ORAL | 0 refills | Status: DC

## 2020-06-25 NOTE — Progress Notes (Signed)
Chief Complaint:   OBESITY Angela Tucker is here to discuss her progress with her obesity treatment plan along with follow-up of her obesity related diagnoses.   Today's visit was #: 11 Starting weight: 414 lbs Starting date: 09/19/2019 Today's weight: 415 lbs Today's date: 06/25/2020 Total lbs lost to date: +1 lb Body mass index is 71.23 kg/m.   Interim History: Angela Tucker says she is working on a way to get to the gym.  Her OBGYN started her on Lo LoEstrin (was given samples). Nutrition Plan: the Category 4 Plan for 20% of the time.  Anti-obesity medications: metformin XR 750 mg twice daily. Reported side effects: None. Activity: Cardio video.  Assessment/Plan:   1. Prediabetes Improving, but not optimized. Goal is HgbA1c < 5.7.  Medication: metformin XR 750 mg twice daily.  She will continue to focus on protein-rich, low simple carbohydrate foods. We reviewed the importance of hydration, regular exercise for stress reduction, and restorative sleep.   Lab Results  Component Value Date   HGBA1C 5.8 (H) 03/21/2020   Lab Results  Component Value Date   INSULIN 13.1 10/03/2019   - Refill metFORMIN (GLUCOPHAGE-XR) 750 MG 24 hr tablet; Take 1 tablet (750 mg total) by mouth in the morning and at bedtime.  Dispense: 180 tablet; Refill: 0  2. Vitamin D deficiency Not at goal. Current vitamin D is 17.7, tested on 03/21/2020. Optimal goal > 50 ng/dL.   Plan:  [x]   Continue Vitamin D @50 ,000 IU every week. []   Continue home supplement daily. [x]   Follow-up for routine testing of Vitamin D at least 2-3 times per year to avoid over-replacement.  - Refill Vitamin D, Ergocalciferol, (DRISDOL) 1.25 MG (50000 UNIT) CAPS capsule; Take 1 capsule (50,000 Units total) by mouth every 7 (seven) days.  Dispense: 12 capsule; Refill: 0  3. Other depression, emotional eating Angela Tucker is taking Wellbutrin 150 mg daily.  Controlled. Motivational interviewing as well as evidence-based interventions  for health behavior change were utilized today including the discussion of self monitoring techniques, problem-solving barriers and SMART goal setting techniques.   - Refill buPROPion (WELLBUTRIN SR) 150 MG 12 hr tablet; Take 1 tablet (150 mg total) by mouth daily.  Dispense: 90 tablet; Refill: 0  4. At risk for heart disease Angela Tucker was given approximately 15 minutes of coronary artery disease prevention counseling today. She is 49 y.o. female and has risk factors for heart disease including obesity. We discussed intensive lifestyle modifications today with an emphasis on specific weight loss instructions and strategies.   Repetitive spaced learning was employed today to elicit superior memory formation and behavioral change.  5. Class 3 severe obesity with serious comorbidity and body mass index (BMI) greater than or equal to 70 in adult, unspecified obesity type [facility])  Course: Angela Tucker is currently in the action stage of change. As such, her goal is to continue with weight loss efforts.   Nutrition goals: She has agreed to keeping a food journal and adhering to recommended goals of 1500-1600 calories and 100 grams of protein.   Exercise goals: For substantial health benefits, adults should do at least 150 minutes (2 hours and 30 minutes) a week of moderate-intensity, or 75 minutes (1 hour and 15 minutes) a week of vigorous-intensity aerobic physical activity, or an equivalent combination of moderate- and vigorous-intensity aerobic activity. Aerobic activity should be performed in episodes of at least 10 minutes, and preferably, it should be spread throughout the week.  Behavioral modification strategies: increasing lean protein  intake, decreasing simple carbohydrates, increasing vegetables and increasing water intake.  Angela Tucker has agreed to follow-up with our clinic in 4 weeks. She was informed of the importance of frequent follow-up visits to maximize her success with intensive lifestyle  modifications for her multiple health conditions.   Objective:   Blood pressure (!) 141/79, pulse 65, temperature 97.7 F (36.5 C), temperature source Oral, height 5\' 4"  (1.626 m), weight (!) 415 lb (188.2 kg), SpO2 98 %. Body mass index is 71.23 kg/m.  General: Cooperative, alert, well developed, in no acute distress. HEENT: Conjunctivae and lids unremarkable. Cardiovascular: Regular rhythm.  Lungs: Normal work of breathing. Neurologic: No focal deficits.   Lab Results  Component Value Date   CREATININE 0.78 04/08/2020   BUN 8 04/08/2020   NA 142 04/08/2020   K 4.5 04/08/2020   CL 103 04/08/2020   CO2 30 (H) 04/08/2020   Lab Results  Component Value Date   ALT 11 04/08/2020   AST 11 04/08/2020   ALKPHOS 101 04/08/2020   BILITOT 0.3 04/08/2020   Lab Results  Component Value Date   HGBA1C 5.8 (H) 03/21/2020   HGBA1C 5.8 (H) 10/03/2019   HGBA1C 5.7 02/19/2016   HGBA1C 6.4 (H) 08/29/2014   Lab Results  Component Value Date   INSULIN 13.1 10/03/2019   Lab Results  Component Value Date   TSH 1.520 10/03/2019   Lab Results  Component Value Date   CHOL 170 03/21/2020   HDL 39 (L) 03/21/2020   LDLCALC 117 (H) 03/21/2020   TRIG 76 03/21/2020   CHOLHDL 4.4 03/21/2020   Lab Results  Component Value Date   WBC 9.7 04/08/2020   HGB 12.1 04/08/2020   HCT 37.3 04/08/2020   MCV 76 (L) 04/08/2020   PLT 434 04/08/2020   Lab Results  Component Value Date   IRON 37 10/03/2019   TIBC 285 10/03/2019   FERRITIN 62 10/03/2019   Attestation Statements:   Reviewed by clinician on day of visit: allergies, medications, problem list, medical history, surgical history, family history, social history, and previous encounter notes.  I, Water quality scientist, CMA, am acting as transcriptionist for Briscoe Deutscher, DO  I have reviewed the above documentation for accuracy and completeness, and I agree with the above. Briscoe Deutscher, DO

## 2020-06-30 ENCOUNTER — Encounter (INDEPENDENT_AMBULATORY_CARE_PROVIDER_SITE_OTHER): Payer: Self-pay | Admitting: Family Medicine

## 2020-07-25 ENCOUNTER — Encounter (INDEPENDENT_AMBULATORY_CARE_PROVIDER_SITE_OTHER): Payer: Self-pay | Admitting: Family Medicine

## 2020-07-25 ENCOUNTER — Other Ambulatory Visit: Payer: Self-pay

## 2020-07-25 ENCOUNTER — Telehealth (INDEPENDENT_AMBULATORY_CARE_PROVIDER_SITE_OTHER): Payer: 59 | Admitting: Family Medicine

## 2020-07-25 DIAGNOSIS — E559 Vitamin D deficiency, unspecified: Secondary | ICD-10-CM | POA: Diagnosis not present

## 2020-07-25 DIAGNOSIS — Z9189 Other specified personal risk factors, not elsewhere classified: Secondary | ICD-10-CM

## 2020-07-25 DIAGNOSIS — F3289 Other specified depressive episodes: Secondary | ICD-10-CM

## 2020-07-25 DIAGNOSIS — R7303 Prediabetes: Secondary | ICD-10-CM | POA: Diagnosis not present

## 2020-07-25 DIAGNOSIS — K219 Gastro-esophageal reflux disease without esophagitis: Secondary | ICD-10-CM

## 2020-07-25 DIAGNOSIS — Z6841 Body Mass Index (BMI) 40.0 and over, adult: Secondary | ICD-10-CM

## 2020-07-25 MED ORDER — VITAMIN D (ERGOCALCIFEROL) 1.25 MG (50000 UNIT) PO CAPS: 50000.0000 [IU] | ORAL_CAPSULE | ORAL | 0 refills | Status: DC

## 2020-07-25 NOTE — Progress Notes (Signed)
TeleHealth Visit:  Due to the COVID-19 pandemic, this visit was completed with telemedicine (audio/video) technology to reduce patient and provider exposure as well as to preserve personal protective equipment.   Angela Tucker has verbally consented to this TeleHealth visit. The patient is located at home, the provider is located at the [facility] office. The participants in this visit include the listed provider and patient. The visit was conducted today via MyChart video.  Chief Complaint: OBESITY Angela Tucker is here to discuss her progress with her obesity treatment plan along with follow-up of her obesity related diagnoses. Angela Tucker is on the Category 4 Plan and states she is following her eating plan approximately 40% of the time. Angela Tucker states she is walking for 20 minutes 3 times per week.  Today's visit was #: 12 Starting weight: 414 lbs Starting date: 09/19/2019  Interim History: Angela Tucker says she is happy that she maintained over the holidays.  She reports an increase in morning sweats.  She says she has been drinking more water in the evenings. Challenge:  Eating later at night.  Assessment/Plan:   1. Vitamin D deficiency Not at goal. Current vitamin D is 17.7, tested on 03/21/2020. Optimal goal > 50 ng/dL.   Plan:  [x]   Continue Vitamin D @50 ,000 IU every week. []   Continue home supplement daily. [x]   Follow-up for routine testing of Vitamin D at least 2-3 times per year to avoid over-replacement.  - Refill Vitamin D, Ergocalciferol, (DRISDOL) 1.25 MG (50000 UNIT) CAPS capsule; Take 1 capsule (50,000 Units total) by mouth every 7 (seven) days.  Dispense: 12 capsule; Refill: 0  2. Prediabetes Improving, but not optimized. Goal is HgbA1c < 5.7.  Medication: metformin XR 750 mg twice daily.  She says it is hard to remember to take the second dose.    Plan:  Okay to take metformin 2 tablets in the morning.  Lab Results  Component Value Date   HGBA1C 5.8 (H)  03/21/2020   Lab Results  Component Value Date   INSULIN 13.1 10/03/2019   3. Gastroesophageal reflux disease, unspecified whether esophagitis present She is taking Nexium 40 mg daily and Pepcid 20 mg twice daily for GERD.    Plan:  Eat dinner 3 hours prior to going to bed.  4. Other depression, emotional eating Angela Tucker is taking Wellbutrin 150 mg daily.  Behavior modification techniques were discussed today to help Angela Tucker deal with her emotional/non-hunger eating behaviors.    5. Class 3 severe obesity with serious comorbidity and body mass index (BMI) greater than or equal to 70 in adult, unspecified obesity type (HCC)  Angela Tucker is currently in the action stage of change. As such, her goal is to continue with weight loss efforts. She has agreed to keeping a food journal and adhering to recommended goals of 1500-1600 calories and 100 grams of protein.   Exercise goals: For substantial health benefits, adults should do at least 150 minutes (2 hours and 30 minutes) a week of moderate-intensity, or 75 minutes (1 hour and 15 minutes) a week of vigorous-intensity aerobic physical activity, or an equivalent combination of moderate- and vigorous-intensity aerobic activity. Aerobic activity should be performed in episodes of at least 10 minutes, and preferably, it should be spread throughout the week.  Behavioral modification strategies: increasing lean protein intake, decreasing simple carbohydrates, increasing vegetables, increasing water intake, decreasing liquid calories and decreasing alcohol intake.  Angela Tucker has agreed to follow-up with our clinic in 3 weeks. She was informed of  the importance of frequent follow-up visits to maximize her success with intensive lifestyle modifications for her multiple health conditions.  Objective:   VITALS: Per patient if applicable, see vitals. GENERAL: Alert and in no acute distress. CARDIOPULMONARY: No increased WOB. Speaking in clear sentences.   PSYCH: Pleasant and cooperative. Speech normal rate and rhythm. Affect is appropriate. Insight and judgement are appropriate. Attention is focused, linear, and appropriate.  NEURO: Oriented as arrived to appointment on time with no prompting.   Lab Results  Component Value Date   CREATININE 0.78 04/08/2020   BUN 8 04/08/2020   NA 142 04/08/2020   K 4.5 04/08/2020   CL 103 04/08/2020   CO2 30 (H) 04/08/2020   Lab Results  Component Value Date   ALT 11 04/08/2020   AST 11 04/08/2020   ALKPHOS 101 04/08/2020   BILITOT 0.3 04/08/2020   Lab Results  Component Value Date   HGBA1C 5.8 (H) 03/21/2020   HGBA1C 5.8 (H) 10/03/2019   HGBA1C 5.7 02/19/2016   HGBA1C 6.4 (H) 08/29/2014   Lab Results  Component Value Date   INSULIN 13.1 10/03/2019   Lab Results  Component Value Date   TSH 1.520 10/03/2019   Lab Results  Component Value Date   CHOL 170 03/21/2020   HDL 39 (L) 03/21/2020   LDLCALC 117 (H) 03/21/2020   TRIG 76 03/21/2020   CHOLHDL 4.4 03/21/2020   Lab Results  Component Value Date   WBC 9.7 04/08/2020   HGB 12.1 04/08/2020   HCT 37.3 04/08/2020   MCV 76 (L) 04/08/2020   PLT 434 04/08/2020   Lab Results  Component Value Date   IRON 37 10/03/2019   TIBC 285 10/03/2019   FERRITIN 62 10/03/2019   Attestation Statements:   Reviewed by clinician on day of visit: allergies, medications, problem list, medical history, surgical history, family history, social history, and previous encounter notes.  I, Water quality scientist, CMA, am acting as transcriptionist for Briscoe Deutscher, DO  I have reviewed the above documentation for accuracy and completeness, and I agree with the above. Briscoe Deutscher, DO

## 2020-08-19 ENCOUNTER — Ambulatory Visit (INDEPENDENT_AMBULATORY_CARE_PROVIDER_SITE_OTHER): Payer: 59 | Admitting: Family Medicine

## 2020-09-12 ENCOUNTER — Ambulatory Visit (INDEPENDENT_AMBULATORY_CARE_PROVIDER_SITE_OTHER): Payer: 59 | Admitting: Family Medicine

## 2020-09-24 ENCOUNTER — Ambulatory Visit (INDEPENDENT_AMBULATORY_CARE_PROVIDER_SITE_OTHER): Payer: 59 | Admitting: Family Medicine

## 2020-09-24 ENCOUNTER — Encounter (INDEPENDENT_AMBULATORY_CARE_PROVIDER_SITE_OTHER): Payer: Self-pay | Admitting: Family Medicine

## 2020-09-24 ENCOUNTER — Other Ambulatory Visit: Payer: Self-pay

## 2020-09-24 VITALS — BP 122/74 | HR 66 | Temp 98.3°F | Ht 64.0 in | Wt >= 6400 oz

## 2020-09-24 DIAGNOSIS — E559 Vitamin D deficiency, unspecified: Secondary | ICD-10-CM | POA: Diagnosis not present

## 2020-09-24 DIAGNOSIS — Z9189 Other specified personal risk factors, not elsewhere classified: Secondary | ICD-10-CM

## 2020-09-24 DIAGNOSIS — F3289 Other specified depressive episodes: Secondary | ICD-10-CM

## 2020-09-24 DIAGNOSIS — E8881 Metabolic syndrome: Secondary | ICD-10-CM

## 2020-09-24 DIAGNOSIS — Z6841 Body Mass Index (BMI) 40.0 and over, adult: Secondary | ICD-10-CM

## 2020-09-24 DIAGNOSIS — K219 Gastro-esophageal reflux disease without esophagitis: Secondary | ICD-10-CM

## 2020-09-24 DIAGNOSIS — R7303 Prediabetes: Secondary | ICD-10-CM

## 2020-09-24 DIAGNOSIS — I10 Essential (primary) hypertension: Secondary | ICD-10-CM

## 2020-09-24 DIAGNOSIS — R7301 Impaired fasting glucose: Secondary | ICD-10-CM

## 2020-09-24 MED ORDER — WEGOVY 0.25 MG/0.5ML ~~LOC~~ SOAJ: 0.2500 mg | SUBCUTANEOUS | 0 refills | Status: DC

## 2020-09-25 LAB — COMPREHENSIVE METABOLIC PANEL
ALT: 24 IU/L (ref 0–32)
AST: 13 IU/L (ref 0–40)
Albumin/Globulin Ratio: 1.5 (ref 1.2–2.2)
Albumin: 3.8 g/dL (ref 3.8–4.8)
Alkaline Phosphatase: 96 IU/L (ref 44–121)
BUN/Creatinine Ratio: 11 (ref 9–23)
BUN: 10 mg/dL (ref 6–24)
Bilirubin Total: 0.3 mg/dL (ref 0.0–1.2)
CO2: 22 mmol/L (ref 20–29)
Calcium: 9.4 mg/dL (ref 8.7–10.2)
Chloride: 105 mmol/L (ref 96–106)
Creatinine, Ser: 0.9 mg/dL (ref 0.57–1.00)
Globulin, Total: 2.5 g/dL (ref 1.5–4.5)
Glucose: 94 mg/dL (ref 65–99)
Potassium: 5.1 mmol/L (ref 3.5–5.2)
Sodium: 142 mmol/L (ref 134–144)
Total Protein: 6.3 g/dL (ref 6.0–8.5)
eGFR: 78 mL/min/{1.73_m2} (ref >59)

## 2020-09-25 LAB — CBC WITH DIFFERENTIAL/PLATELET
Basophils Absolute: 0.1 10*3/uL (ref 0.0–0.2)
Basos: 1 %
EOS (ABSOLUTE): 0.2 10*3/uL (ref 0.0–0.4)
Eos: 2 %
Hemoglobin: 10.9 g/dL — ABNORMAL LOW (ref 11.1–15.9)
Immature Grans (Abs): 0 10*3/uL (ref 0.0–0.1)
Immature Granulocytes: 0 %
Lymphocytes Absolute: 3.5 10*3/uL — ABNORMAL HIGH (ref 0.7–3.1)
Lymphs: 35 %
MCH: 24 pg — ABNORMAL LOW (ref 26.6–33.0)
MCHC: 30.7 g/dL — ABNORMAL LOW (ref 31.5–35.7)
MCV: 78 fL — ABNORMAL LOW (ref 79–97)
Monocytes Absolute: 0.5 10*3/uL (ref 0.1–0.9)
Monocytes: 5 %
Neutrophils Absolute: 5.8 10*3/uL (ref 1.4–7.0)
Neutrophils: 57 %
Platelets: 542 10*3/uL — ABNORMAL HIGH (ref 150–450)
RBC: 4.54 x10E6/uL (ref 3.77–5.28)
RDW: 14.7 % (ref 11.7–15.4)
WBC: 10.2 10*3/uL (ref 3.4–10.8)

## 2020-09-25 LAB — LIPID PANEL
Chol/HDL Ratio: 4.7 ratio — ABNORMAL HIGH (ref 0.0–4.4)
Cholesterol, Total: 183 mg/dL (ref 100–199)
HDL: 39 mg/dL — ABNORMAL LOW (ref >39)
LDL Chol Calc (NIH): 132 mg/dL — ABNORMAL HIGH (ref 0–99)
Triglycerides: 66 mg/dL (ref 0–149)
VLDL Cholesterol Cal: 12 mg/dL (ref 5–40)

## 2020-09-25 LAB — ANEMIA PANEL
Ferritin: 25 ng/mL (ref 15–150)
Folate, Hemolysate: 314 ng/mL
Folate, RBC: 885 ng/mL (ref >498)
Hematocrit: 35.5 % (ref 34.0–46.6)
Iron Saturation: 7 % — CL (ref 15–55)
Iron: 23 ug/dL — ABNORMAL LOW (ref 27–159)
Retic Ct Pct: 1.5 % (ref 0.6–2.6)
Total Iron Binding Capacity: 317 ug/dL (ref 250–450)
UIBC: 294 ug/dL (ref 131–425)
Vitamin B-12: 431 pg/mL (ref 232–1245)

## 2020-09-25 LAB — HEMOGLOBIN A1C
Est. average glucose Bld gHb Est-mCnc: 114 mg/dL
Hgb A1c MFr Bld: 5.6 % (ref 4.8–5.6)

## 2020-09-25 LAB — TSH: TSH: 1.46 u[IU]/mL (ref 0.450–4.500)

## 2020-09-25 LAB — VITAMIN D 25 HYDROXY (VIT D DEFICIENCY, FRACTURES): Vit D, 25-Hydroxy: 22.3 ng/mL — ABNORMAL LOW (ref 30.0–100.0)

## 2020-09-25 LAB — T4, FREE: Free T4: 1.21 ng/dL (ref 0.82–1.77)

## 2020-09-25 LAB — INSULIN, RANDOM: INSULIN: 15.3 u[IU]/mL (ref 2.6–24.9)

## 2020-10-01 NOTE — Progress Notes (Signed)
Chief Complaint:   OBESITY Angela Tucker is here to discuss her progress with her obesity treatment plan along with follow-up of her obesity related diagnoses.   Today's visit was #: 13 Starting weight: 414 lbs Starting date: 09/19/2019 Today's weight: 410 lbs Today's date: 09/24/2020 Total lbs lost to date: 4 lbs Body mass index is 70.38 kg/m.  Total weight loss percentage to date: -0.97%  Interim History:  Angela Tucker's last visit was a video visit on 07/25/2020.  She is doing Category 4 and is having a smoothie instead of breakfast. Current Meal Plan: the Category 4 Plan for 25% of the time.  Current Exercise Plan: Walking for 15-20 minutes 3 times per week. Current Anti-Obesity Medications: Metformin 750 mg twice daily. Side effects: None.  Assessment/Plan:   1. Prediabetes At goal. Goal is HgbA1c < 5.7.  Medication: metforin 750 mg twice daily.    Plan:  She will continue to focus on protein-rich, low simple carbohydrate foods. We reviewed the importance of hydration, regular exercise for stress reduction, and restorative sleep.   Lab Results  Component Value Date   HGBA1C 5.6 09/24/2020   Lab Results  Component Value Date   INSULIN 15.3 09/24/2020   INSULIN 13.1 10/03/2019   - Hemoglobin A1c - Insulin, random - Comprehensive metabolic panel  2. Vitamin D deficiency Not at goal. Current vitamin D is 17.7, tested on 03/21/2020. Optimal goal > 50 ng/dL.  She is taking vitamin D 50,000 IU weekly.  Plan: Continue to take prescription Vitamin D @50 ,000 IU every week as prescribed.  Will check vitamin D level today.  - VITAMIN D 25 Hydroxy (Vit-D Deficiency, Fractures)  3. Gastroesophageal reflux disease, unspecified whether esophagitis present She is taking Pepcid 20 mg twice daily.  Plan:  Continue Pepcid.  We reviewed the diagnosis of GERD and importance of treatment. We discussed "red flag" symptoms and the importance of follow up if symptoms persisted despite  treatment. We reviewed non-pharmacologic management of GERD symptoms: including: caffeine reduction, dietary changes, elevate HOB, NPO after supper, reduction of alcohol intake, tobacco cessation, and weight loss.  4. Impaired fasting glucose Will check labs today.  Will also start Wegovy 0.25 mg subcutaneously weekly, as per below.   - Hemoglobin A1c - Insulin, random - Comprehensive metabolic panel - Start Semaglutide-Weight Management (WEGOVY) 0.25 MG/0.5ML SOAJ; Inject 0.25 mg into the skin once a week.  Dispense: 2 mL; Refill: 0  5. Essential hypertension At goal. Medications: Toprol-XL 50 mg daily.   Plan: Avoid buying foods that are: processed, frozen, or prepackaged to avoid excess salt. We will continue to monitor closely alongside her PCP and/or Specialist.  Regular follow up with PCP and specialists was also encouraged.   BP Readings from Last 3 Encounters:  09/24/20 122/74  06/25/20 (!) 141/79  05/13/20 104/69   Lab Results  Component Value Date   CREATININE 0.90 09/24/2020   6. Metabolic syndrome Starting goal: Lose 7-10% of starting weight. She will continue to focus on protein-rich, low simple carbohydrate foods. We reviewed the importance of hydration, regular exercise for stress reduction, and restorative sleep.  We will continue to check lab work every 3 months, with 10% weight loss, or should any other concerns arise.  - Anemia panel - Lipid panel - CBC with Differential/Platelet - TSH - T4, free  7. Other depression, with emotional eating Controlled. Medication: Wellbutrin 150 mg daily.  Plan:  Continue Wellbutrin.  Behavior modification techniques were discussed today to help deal with  emotional/non-hunger eating behaviors.  8. At risk for heart disease Due to Angela Tucker's current state of health and medical condition(s), she is at a higher risk for heart disease.  This puts the patient at much greater risk to subsequently develop cardiopulmonary conditions  that can significantly affect patient's quality of life in a negative manner.    At least 9 minutes were spent on counseling Angela Tucker about these concerns today. Evidence-based interventions for health behavior change were utilized today including the discussion of self monitoring techniques, problem-solving barriers, and SMART goal setting techniques.  Specifically, regarding patient's less desirable eating habits and patterns, we employed the technique of small changes when Angela Tucker has not been able to fully commit to her prudent nutritional plan.  9. Class 3 severe obesity with serious comorbidity and body mass index (BMI) greater than or equal to 70 in adult, unspecified obesity type [facility])  Course: Angela Tucker is currently in the action stage of change. As such, her goal is to continue with weight loss efforts.   Nutrition goals: She has agreed to the Category 3 Plan.  Also gave calorie/protein breakdown.   Exercise goals: Increase activity.  Behavioral modification strategies: increasing lean protein intake, decreasing simple carbohydrates, increasing vegetables and increasing water intake.  Angela Tucker has agreed to follow-up with our clinic in 4 weeks.  Okay for next appointment to be virtual.  She was informed of the importance of frequent follow-up visits to maximize her success with intensive lifestyle modifications for her multiple health conditions.   Objective:   Blood pressure 122/74, pulse 66, temperature 98.3 F (36.8 C), temperature source Oral, height 5\' 4"  (1.626 m), weight (!) 410 lb (186 kg), last menstrual period 09/16/2020, SpO2 98 %. Body mass index is 70.38 kg/m.  General: Cooperative, alert, well developed, in no acute distress. HEENT: Conjunctivae and lids unremarkable. Cardiovascular: Regular rhythm.  Lungs: Normal work of breathing. Neurologic: No focal deficits.   Lab Results  Component Value Date   CREATININE 0.90 09/24/2020   BUN 10 09/24/2020   NA 142  09/24/2020   K 5.1 09/24/2020   CL 105 09/24/2020   CO2 22 09/24/2020   Lab Results  Component Value Date   ALT 24 09/24/2020   AST 13 09/24/2020   ALKPHOS 96 09/24/2020   BILITOT 0.3 09/24/2020   Lab Results  Component Value Date   HGBA1C 5.6 09/24/2020   HGBA1C 5.8 (H) 03/21/2020   HGBA1C 5.8 (H) 10/03/2019   HGBA1C 5.7 02/19/2016   HGBA1C 6.4 (H) 08/29/2014   Lab Results  Component Value Date   INSULIN 15.3 09/24/2020   INSULIN 13.1 10/03/2019   Lab Results  Component Value Date   TSH 1.460 09/24/2020   Lab Results  Component Value Date   CHOL 183 09/24/2020   HDL 39 (L) 09/24/2020   LDLCALC 132 (H) 09/24/2020   TRIG 66 09/24/2020   CHOLHDL 4.7 (H) 09/24/2020   Lab Results  Component Value Date   WBC 10.2 09/24/2020   HGB 10.9 (L) 09/24/2020   HCT 35.5 09/24/2020   MCV 78 (L) 09/24/2020   PLT 542 (H) 09/24/2020   Lab Results  Component Value Date   IRON 23 (L) 09/24/2020   TIBC 317 09/24/2020   FERRITIN 25 09/24/2020   Attestation Statements:   Reviewed by clinician on day of visit: allergies, medications, problem list, medical history, surgical history, family history, social history, and previous encounter notes.  I, Water quality scientist, CMA, am acting as Location manager for [employer], DO  I have reviewed the above documentation for accuracy and completeness, and I agree with the above. Briscoe Deutscher, DO

## 2020-10-28 ENCOUNTER — Encounter (INDEPENDENT_AMBULATORY_CARE_PROVIDER_SITE_OTHER): Payer: Self-pay | Admitting: Family Medicine

## 2020-10-28 ENCOUNTER — Telehealth (INDEPENDENT_AMBULATORY_CARE_PROVIDER_SITE_OTHER): Payer: 59 | Admitting: Family Medicine

## 2020-10-28 ENCOUNTER — Other Ambulatory Visit: Payer: Self-pay

## 2020-10-28 DIAGNOSIS — Z6841 Body Mass Index (BMI) 40.0 and over, adult: Secondary | ICD-10-CM

## 2020-10-28 DIAGNOSIS — D500 Iron deficiency anemia secondary to blood loss (chronic): Secondary | ICD-10-CM

## 2020-10-28 DIAGNOSIS — E8881 Metabolic syndrome: Secondary | ICD-10-CM

## 2020-10-28 DIAGNOSIS — R7301 Impaired fasting glucose: Secondary | ICD-10-CM | POA: Diagnosis not present

## 2020-10-28 MED ORDER — OZEMPIC (0.25 OR 0.5 MG/DOSE) 2 MG/1.5ML ~~LOC~~ SOPN
0.5000 mg | PEN_INJECTOR | SUBCUTANEOUS | 0 refills | Status: DC
Start: 2020-10-28 — End: 2021-02-02

## 2020-10-29 NOTE — Progress Notes (Signed)
TeleHealth Visit:  Due to the COVID-19 pandemic, this visit was completed with telemedicine (audio/video) technology to reduce patient and provider exposure as well as to preserve personal protective equipment.   Angela Tucker has verbally consented to this TeleHealth visit. The patient is located at home, the provider is located at the [facility] office. The participants in this visit include the listed provider and patient. The visit was conducted today via MyChart video.  Chief Complaint: OBESITY Angela Tucker is here to discuss her progress with her obesity treatment plan along with follow-up of her obesity related diagnoses. Angela Tucker is on the Category 4 Plan and states she is following her eating plan approximately 40% of the time. Angela Tucker states she is walking for 15 minutes 2 times per week.  Today's visit was #: 14 Starting weight: 414 lbs Starting date: 09/19/2019 Current Anti-Obesity Medications: Metformin 750 mg twice daily. Side effects: None.  Interim History: Angela Tucker says that she has been more consistent with the plan.  She would like to Angela Tucker.  She will get labs on June 3rd.  Assessment/Plan:   1. Metabolic syndrome Starting goal: Lose 7-10% of starting weight. She will continue to focus on protein-rich, low simple carbohydrate foods. We reviewed the importance of hydration, regular exercise for stress reduction, and restorative sleep.  We will continue to check lab work every 3 months, with 10% weight loss, or should any other concerns arise.  2. Iron deficiency anemia due to chronic blood loss Angela Tucker is taking a prenatal vitamin with iron daily.  Plan:  Continue daily prenatal vitamin.  Nutrition: Iron-rich foods include dark leafy greens, red and white meats, eggs, seafood, and beans.  Certain foods and drinks prevent your body from absorbing iron properly. Avoid eating these foods in the same meal as iron-rich foods or with iron supplements.  These foods include: coffee, black tea, and red wine; milk, dairy products, and foods that are high in calcium; beans and soybeans; whole grains. Constipation can be a side effect of iron supplementation. Increased water and fiber intake are helpful. Water goal: > 2 liters/day. Fiber goal: > 25 grams/day.  CBC Latest Ref Rng & Units 09/24/2020 04/08/2020 03/21/2020  WBC 3.4 - 10.8 x10E3/uL 10.2 9.7 9.7  Hemoglobin 11.1 - 15.9 g/dL 10.9(L) 12.1 12.0  Hematocrit 34.0 - 46.6 % 35.5 37.3 39.3  Platelets 150 - 450 x10E3/uL 542(H) 434 385   Lab Results  Component Value Date   IRON 23 (L) 09/24/2020   TIBC 317 09/24/2020   FERRITIN 25 09/24/2020   Lab Results  Component Value Date   VITAMINB12 431 09/24/2020   3. Impaired fasting glucose Start Ozempic, as per below.  - Start Semaglutide,0.25 or 0.5MG /DOS, (OZEMPIC, 0.25 OR 0.5 MG/DOSE,) 2 MG/1.5ML SOPN; Inject 0.5 mg into the skin once a week.  Dispense: 1.5 mL; Refill: 0  4. Obesity, current BMI 70.38  Angela Tucker is currently in the action stage of change. As such, her goal is to continue with weight loss efforts. She has agreed to the Category 4 Plan.   Exercise goals: For substantial health benefits, adults should do at least 150 minutes (2 hours and 30 minutes) a week of moderate-intensity, or 75 minutes (1 hour and 15 minutes) a week of vigorous-intensity aerobic physical activity, or an equivalent combination of moderate- and vigorous-intensity aerobic activity. Aerobic activity should be performed in episodes of at least 10 minutes, and preferably, it should be spread throughout the week. We discussed chair exercises.  Behavioral modification strategies: increasing lean protein intake, decreasing simple carbohydrates, increasing vegetables and increasing water intake.  Angela Tucker has agreed to follow-up with our clinic in 4 weeks. She was informed of the importance of frequent follow-up visits to maximize her success with intensive lifestyle  modifications for her multiple health conditions.  Objective:   VITALS: Per patient if applicable, see vitals. GENERAL: Alert and in no acute distress. CARDIOPULMONARY: No increased WOB. Speaking in clear sentences.  PSYCH: Pleasant and cooperative. Speech normal rate and rhythm. Affect is appropriate. Insight and judgement are appropriate. Attention is focused, linear, and appropriate.  NEURO: Oriented as arrived to appointment on time with no prompting.   Lab Results  Component Value Date   CREATININE 0.90 09/24/2020   BUN 10 09/24/2020   NA 142 09/24/2020   K 5.1 09/24/2020   CL 105 09/24/2020   CO2 22 09/24/2020   Lab Results  Component Value Date   ALT 24 09/24/2020   AST 13 09/24/2020   ALKPHOS 96 09/24/2020   BILITOT 0.3 09/24/2020   Lab Results  Component Value Date   HGBA1C 5.6 09/24/2020   HGBA1C 5.8 (H) 03/21/2020   HGBA1C 5.8 (H) 10/03/2019   HGBA1C 5.7 02/19/2016   HGBA1C 6.4 (H) 08/29/2014   Lab Results  Component Value Date   INSULIN 15.3 09/24/2020   INSULIN 13.1 10/03/2019   Lab Results  Component Value Date   TSH 1.460 09/24/2020   Lab Results  Component Value Date   CHOL 183 09/24/2020   HDL 39 (L) 09/24/2020   LDLCALC 132 (H) 09/24/2020   TRIG 66 09/24/2020   CHOLHDL 4.7 (H) 09/24/2020   Lab Results  Component Value Date   WBC 10.2 09/24/2020   HGB 10.9 (L) 09/24/2020   HCT 35.5 09/24/2020   MCV 78 (L) 09/24/2020   PLT 542 (H) 09/24/2020   Lab Results  Component Value Date   IRON 23 (L) 09/24/2020   TIBC 317 09/24/2020   FERRITIN 25 09/24/2020   Attestation Statements:   Reviewed by clinician on day of visit: allergies, medications, problem list, medical history, surgical history, family history, social history, and previous encounter notes.  I, Water quality scientist, CMA, am acting as transcriptionist for Briscoe Deutscher, DO  I have reviewed the above documentation for accuracy and completeness, and I agree with the above. Briscoe Deutscher, DO

## 2020-11-25 ENCOUNTER — Encounter (INDEPENDENT_AMBULATORY_CARE_PROVIDER_SITE_OTHER): Payer: Self-pay | Admitting: Family Medicine

## 2020-11-25 ENCOUNTER — Other Ambulatory Visit: Payer: Self-pay

## 2020-11-25 ENCOUNTER — Telehealth (INDEPENDENT_AMBULATORY_CARE_PROVIDER_SITE_OTHER): Payer: Self-pay

## 2020-11-25 ENCOUNTER — Telehealth (INDEPENDENT_AMBULATORY_CARE_PROVIDER_SITE_OTHER): Payer: 59 | Admitting: Family Medicine

## 2020-11-25 DIAGNOSIS — D500 Iron deficiency anemia secondary to blood loss (chronic): Secondary | ICD-10-CM

## 2020-11-25 DIAGNOSIS — E785 Hyperlipidemia, unspecified: Secondary | ICD-10-CM | POA: Diagnosis not present

## 2020-11-25 DIAGNOSIS — R7303 Prediabetes: Secondary | ICD-10-CM | POA: Diagnosis not present

## 2020-11-25 DIAGNOSIS — Z6841 Body Mass Index (BMI) 40.0 and over, adult: Secondary | ICD-10-CM

## 2020-11-25 NOTE — Progress Notes (Signed)
TeleHealth Visit:  Due to the COVID-19 pandemic, this visit was completed with telemedicine (audio/video) technology to reduce patient and provider exposure as well as to preserve personal protective equipment.   Angela Tucker has verbally consented to this TeleHealth visit. The patient is located at home, the provider is located at the [facility] office. The participants in this visit include the listed provider and patient. The visit was conducted today via MyChart video.  OBESITY Angela Tucker is here to discuss her progress with her obesity treatment plan along with follow-up of her obesity related diagnoses.   Today's visit was #: 15 Starting weight: 414 lbs Starting date: 09/19/2019 Today's date: 11/25/2020  Interim History: Represcribed semaglutide on 10/28/2020 (has not started yet). She went to the beach and got back yesterday.  She has increased her water intake.  She has also been walking more - 6,000 steps per day. Nutrition Plan: the Category 4 Plan for 40% of the tim.  Activity: Walking for 15 minutes 3 times per week.  Assessment/Plan:   1. Prediabetes At goal. Goal is HgbA1c < 5.7.  Medication: metformin 750 mg daily.    Plan:  She will continue to focus on protein-rich, low simple carbohydrate foods. We reviewed the importance of hydration, regular exercise for stress reduction, and restorative sleep.   Lab Results  Component Value Date   HGBA1C 5.6 09/24/2020   Lab Results  Component Value Date   INSULIN 15.3 09/24/2020   INSULIN 13.1 10/03/2019   2. Iron deficiency anemia due to chronic blood loss Angela Tucker is taking iron.  Hgb is 10.9.  Plan:  Will recheck level at next visit on June 15.  Nutrition: Iron-rich foods include dark leafy greens, red and white meats, eggs, seafood, and beans.  Certain foods and drinks prevent your body from absorbing iron properly. Avoid eating these foods in the same meal as iron-rich foods or with iron supplements.  These foods include: coffee, black tea, and red wine; milk, dairy products, and foods that are high in calcium; beans and soybeans; whole grains. Constipation can be a side effect of iron supplementation. Increased water and fiber intake are helpful. Water goal: > 2 liters/day. Fiber goal: > 25 grams/day.  CBC Latest Ref Rng & Units 09/24/2020 04/08/2020 03/21/2020  WBC 3.4 - 10.8 x10E3/uL 10.2 9.7 9.7  Hemoglobin 11.1 - 15.9 g/dL 10.9(L) 12.1 12.0  Hematocrit 34.0 - 46.6 % 35.5 37.3 39.3  Platelets 150 - 450 x10E3/uL 542(H) 434 385   Lab Results  Component Value Date   IRON 23 (L) 09/24/2020   TIBC 317 09/24/2020   FERRITIN 25 09/24/2020   Lab Results  Component Value Date   VITAMINB12 431 09/24/2020   3. Dyslipidemia Course: Not at goal. Lipid-lowering medications: None.   Plan: Dietary changes: Increase soluble fiber, decrease simple carbohydrates, decrease saturated fat. Exercise changes: Moderate to vigorous-intensity aerobic activity 150 minutes per week or as tolerated. We will continue to monitor along with PCP/specialists as it pertains to her weight loss journey.  Lab Results  Component Value Date   CHOL 183 09/24/2020   HDL 39 (L) 09/24/2020   LDLCALC 132 (H) 09/24/2020   TRIG 66 09/24/2020   CHOLHDL 4.7 (H) 09/24/2020   Lab Results  Component Value Date   ALT 24 09/24/2020   AST 13 09/24/2020   ALKPHOS 96 09/24/2020   BILITOT 0.3 09/24/2020   The 10-year ASCVD risk score Angela Bussing DC Jr., et al., 2013) is: 2.4%  Values used to calculate the score:     Age: 21 years     Sex: Female     Is Non-Hispanic African American: Yes     Diabetic: No     Tobacco smoker: No     Systolic Blood Pressure: 119 mmHg     Is BP treated: No     HDL Cholesterol: 39 mg/dL     Total Cholesterol: 183 mg/dL  4. Obesity, current BMI 70.38  Angela Tucker is currently in the action stage of change. As such, her goal is to continue with weight loss efforts. She has agreed to the Category 4  Plan.   Exercise goals: Increase steps.  Behavioral modification strategies: increasing lean protein intake, decreasing simple carbohydrates, increasing vegetables, increasing water intake and decreasing liquid calories.  [name] has agreed to follow-up with our clinic in 4 weeks. She was informed of the importance of frequent follow-up visits to maximize her success with intensive lifestyle modifications for her multiple health conditions.  Objective:   VITALS: Per patient if applicable, see vitals. GENERAL: Alert and in no acute distress. CARDIOPULMONARY: No increased WOB. Speaking in clear sentences.  PSYCH: Pleasant and cooperative. Speech normal rate and rhythm. Affect is appropriate. Insight and judgement are appropriate. Attention is focused, linear, and appropriate.  NEURO: Oriented as arrived to appointment on time with no prompting.   Lab Results  Component Value Date   CREATININE 0.90 09/24/2020   BUN 10 09/24/2020   NA 142 09/24/2020   K 5.1 09/24/2020   CL 105 09/24/2020   CO2 22 09/24/2020   Lab Results  Component Value Date   ALT 24 09/24/2020   AST 13 09/24/2020   ALKPHOS 96 09/24/2020   BILITOT 0.3 09/24/2020   Lab Results  Component Value Date   HGBA1C 5.6 09/24/2020   HGBA1C 5.8 (H) 03/21/2020   HGBA1C 5.8 (H) 10/03/2019   HGBA1C 5.7 02/19/2016   HGBA1C 6.4 (H) 08/29/2014   Lab Results  Component Value Date   INSULIN 15.3 09/24/2020   INSULIN 13.1 10/03/2019   Lab Results  Component Value Date   TSH 1.460 09/24/2020   Lab Results  Component Value Date   CHOL 183 09/24/2020   HDL 39 (L) 09/24/2020   LDLCALC 132 (H) 09/24/2020   TRIG 66 09/24/2020   CHOLHDL 4.7 (H) 09/24/2020   Lab Results  Component Value Date   WBC 10.2 09/24/2020   HGB 10.9 (L) 09/24/2020   HCT 35.5 09/24/2020   MCV 78 (L) 09/24/2020   PLT 542 (H) 09/24/2020   Lab Results  Component Value Date   IRON 23 (L) 09/24/2020   TIBC 317 09/24/2020   FERRITIN 25  09/24/2020   Attestation Statements:   Reviewed by clinician on day of visit: allergies, medications, problem list, medical history, surgical history, family history, social history, and previous encounter notes.  Time spent on visit including pre-visit chart review and post-visit charting and care was 32 minutes.   I, Water quality scientist, CMA, am acting as transcriptionist for Briscoe Deutscher, DO  I have reviewed the above documentation for accuracy and completeness, and I agree with the above. Briscoe Deutscher, DO

## 2020-11-25 NOTE — Telephone Encounter (Signed)
The request for coverage for Ozempic Inj 2/1.40ml, use as directed (1.5 per 28 days), is denied. This decision is based on health plan criteria for Ozempic. This medicine is covered only if: You have a diagnosis of type 2 diabetes mellitus

## 2020-12-25 ENCOUNTER — Ambulatory Visit (INDEPENDENT_AMBULATORY_CARE_PROVIDER_SITE_OTHER): Payer: 59 | Admitting: Family Medicine

## 2021-01-28 ENCOUNTER — Other Ambulatory Visit: Payer: Self-pay

## 2021-01-28 ENCOUNTER — Encounter (INDEPENDENT_AMBULATORY_CARE_PROVIDER_SITE_OTHER): Payer: Self-pay | Admitting: Family Medicine

## 2021-01-28 ENCOUNTER — Ambulatory Visit (INDEPENDENT_AMBULATORY_CARE_PROVIDER_SITE_OTHER): Payer: 59 | Admitting: Family Medicine

## 2021-01-28 VITALS — BP 110/73 | HR 4 | Temp 98.3°F | Ht 64.0 in | Wt >= 6400 oz

## 2021-01-28 DIAGNOSIS — E559 Vitamin D deficiency, unspecified: Secondary | ICD-10-CM

## 2021-01-28 DIAGNOSIS — D508 Other iron deficiency anemias: Secondary | ICD-10-CM | POA: Diagnosis not present

## 2021-01-28 DIAGNOSIS — R7303 Prediabetes: Secondary | ICD-10-CM

## 2021-01-28 DIAGNOSIS — Z9189 Other specified personal risk factors, not elsewhere classified: Secondary | ICD-10-CM | POA: Diagnosis not present

## 2021-01-28 DIAGNOSIS — R7301 Impaired fasting glucose: Secondary | ICD-10-CM | POA: Diagnosis not present

## 2021-01-28 DIAGNOSIS — R632 Polyphagia: Secondary | ICD-10-CM

## 2021-01-28 DIAGNOSIS — Z6841 Body Mass Index (BMI) 40.0 and over, adult: Secondary | ICD-10-CM

## 2021-02-02 MED ORDER — TIRZEPATIDE 2.5 MG/0.5ML ~~LOC~~ SOAJ
2.5000 mg | SUBCUTANEOUS | 0 refills | Status: DC
Start: 2021-02-02 — End: 2021-02-26

## 2021-02-02 MED ORDER — VITAMIN D (ERGOCALCIFEROL) 1.25 MG (50000 UNIT) PO CAPS
50000.0000 [IU] | ORAL_CAPSULE | ORAL | 0 refills | Status: DC
Start: 2021-02-02 — End: 2021-02-26

## 2021-02-02 MED ORDER — PHENTERMINE HCL 37.5 MG PO TABS: 18.7500 mg | ORAL_TABLET | Freq: Every day | ORAL | 0 refills | Status: DC

## 2021-02-02 MED ORDER — METFORMIN HCL ER 750 MG PO TB24: 750.0000 mg | ORAL_TABLET | Freq: Two times a day (BID) | ORAL | 0 refills | Status: DC

## 2021-02-02 NOTE — Progress Notes (Signed)
Chief Complaint:   OBESITY Angela Tucker is here to discuss her progress with her obesity treatment plan along with follow-up of her obesity related diagnoses. See Medical Weight Management Flowsheet for complete bioelectrical impedance results.  Today's visit was #: 38 Starting weight: 414 lbs Starting date: 09/19/2019 Today's weight: 408 lbs Today's date: 01/28/2021 Weight change since last visit: 2 lbs Total lbs lost to date: 6 lbs Body mass index is 70.03 kg/m.  Total weight loss percentage to date: -1.45%  Interim History: Angela Tucker says she is very busy with life with her children.  She has been eating out often. Nutrition Plan: the Category 4 Plan for 0% of the time. Activity:  Increased walking. Anti-obesity medications: Ozempic 0.5 mg subcutaneously weekly. Reported side effects: None.  Assessment/Plan:   1. Impaired fasting glucose Will refill and continue metformin XR 750 mg twice daily.  Also, stop Ozempic and start Mounjaro 2.5 mg subcutaneously weekly, as per below.  - Refill metFORMIN (GLUCOPHAGE-XR) 750 MG 24 hr tablet; Take 1 tablet (750 mg total) by mouth in the morning and at bedtime.  Dispense: 180 tablet; Refill: 0 - Start tirzepatide [facility]) 2.5 MG/0.5ML Pen; Inject 2.5 mg into the skin once a week.  Dispense: 2 mL; Refill: 0  2. Polyphagia Not at goal. Current treatment: Ozempic 0.5 mg subcutaneously weekly. Polyphagia refers to excessive feelings of hunger.   Plan:  Start Phentermine 18.75-37.5 mg daily, as per below.  She will continue to focus on protein-rich, low simple carbohydrate foods. We reviewed the importance of hydration, regular exercise for stress reduction, and restorative sleep.  - Start phentermine (ADIPEX-P) 37.5 MG tablet; Take 0.5-1 tablets (18.75-37.5 mg total) by mouth daily before breakfast.  Dispense: 30 tablet; Refill: 0  3. Other iron deficiency anemia Resolved, per patient.  Last labs through OBGYN.    Plan:  Will request  records.  Nutrition: Iron-rich foods include dark leafy greens, red and white meats, eggs, seafood, and beans.  Certain foods and drinks prevent your body from absorbing iron properly. Avoid eating these foods in the same meal as iron-rich foods or with iron supplements. These foods include: coffee, black tea, and red wine; milk, dairy products, and foods that are high in calcium; beans and soybeans; whole grains. Constipation can be a side effect of iron supplementation. Increased water and fiber intake are helpful. Water goal: > 2 liters/day. Fiber goal: > 25 grams/day.  4. Vitamin D deficiency Not at goal.  She is taking vitamin D 50,000 IU weekly.  Plan: Continue to take prescription Vitamin D '@50'$ ,000 IU every week as prescribed.  Follow-up for routine testing of Vitamin D, at least 2-3 times per year to avoid over-replacement.  Lab Results  Component Value Date   VD25OH 22.3 (L) 09/24/2020   VD25OH 17.7 (L) 03/21/2020   VD25OH 21.4 (L) 10/03/2019   - Refill Vitamin D, Ergocalciferol, (DRISDOL) 1.25 MG (50000 UNIT) CAPS capsule; Take 1 capsule (50,000 Units total) by mouth every 7 (seven) days.  Dispense: 12 capsule; Refill: 0  5. At risk for heart disease Due to Angela Tucker's current state of health and medical condition(s), she is at a higher risk for heart disease.  This puts the patient at much greater risk to subsequently develop cardiopulmonary conditions that can significantly affect patient's quality of life in a negative manner.    At least 8 minutes were spent on counseling Angela Tucker about these concerns today. Evidence-based interventions for health behavior change were utilized today including the  discussion of self monitoring techniques, problem-solving barriers, and SMART goal setting techniques.  Specifically, regarding patient's less desirable eating habits and patterns, we employed the technique of small changes when Angela Tucker has not been able to fully commit to her prudent  nutritional plan.  6. Obesity with current BMI of 70.0  Course: Angela Tucker is currently in the action stage of change. As such, her goal is to continue with weight loss efforts.   Nutrition goals: She has agreed to the Category 4 Plan.   Exercise goals:  As is.  Behavioral modification strategies: increasing lean protein intake, decreasing simple carbohydrates, increasing vegetables, and increasing water intake.  Angela Tucker has agreed to follow-up with our clinic in 4 weeks. She was informed of the importance of frequent follow-up visits to maximize her success with intensive lifestyle modifications for her multiple health conditions.   Objective:   Blood pressure 110/73, pulse (!) 4, temperature 98.3 F (36.8 C), temperature source Oral, height '5\' 4"'$  (1.626 m), weight (!) 408 lb (185.1 kg), SpO2 96 %. Body mass index is 70.03 kg/m.  General: Cooperative, alert, well developed, in no acute distress. HEENT: Conjunctivae and lids unremarkable. Cardiovascular: Regular rhythm.  Lungs: Normal work of breathing. Neurologic: No focal deficits.   Lab Results  Component Value Date   CREATININE 0.90 09/24/2020   BUN 10 09/24/2020   NA 142 09/24/2020   K 5.1 09/24/2020   CL 105 09/24/2020   CO2 22 09/24/2020   Lab Results  Component Value Date   ALT 24 09/24/2020   AST 13 09/24/2020   ALKPHOS 96 09/24/2020   BILITOT 0.3 09/24/2020   Lab Results  Component Value Date   HGBA1C 5.6 09/24/2020   HGBA1C 5.8 (H) 03/21/2020   HGBA1C 5.8 (H) 10/03/2019   HGBA1C 5.7 02/19/2016   HGBA1C 6.4 (H) 08/29/2014   Lab Results  Component Value Date   INSULIN 15.3 09/24/2020   INSULIN 13.1 10/03/2019   Lab Results  Component Value Date   TSH 1.460 09/24/2020   Lab Results  Component Value Date   CHOL 183 09/24/2020   HDL 39 (L) 09/24/2020   LDLCALC 132 (H) 09/24/2020   TRIG 66 09/24/2020   CHOLHDL 4.7 (H) 09/24/2020   Lab Results  Component Value Date   VD25OH 22.3 (L)  09/24/2020   VD25OH 17.7 (L) 03/21/2020   VD25OH 21.4 (L) 10/03/2019   Lab Results  Component Value Date   WBC 10.2 09/24/2020   HGB 10.9 (L) 09/24/2020   HCT 35.5 09/24/2020   MCV 78 (L) 09/24/2020   PLT 542 (H) 09/24/2020   Lab Results  Component Value Date   IRON 23 (L) 09/24/2020   TIBC 317 09/24/2020   FERRITIN 25 09/24/2020   Attestation Statements:   Reviewed by clinician on day of visit: allergies, medications, problem list, medical history, surgical history, family history, social history, and previous encounter notes.  I, Water quality scientist, CMA, am acting as transcriptionist for Briscoe Deutscher, DO  I have reviewed the above documentation for accuracy and completeness, and I agree with the above. Briscoe Deutscher, DO

## 2021-02-03 ENCOUNTER — Encounter (INDEPENDENT_AMBULATORY_CARE_PROVIDER_SITE_OTHER): Payer: Self-pay

## 2021-02-11 ENCOUNTER — Telehealth (INDEPENDENT_AMBULATORY_CARE_PROVIDER_SITE_OTHER): Payer: Self-pay

## 2021-02-11 NOTE — Telephone Encounter (Signed)
Received fax from [facility] and they do not have  CBC or Iron labs for this patient.

## 2021-02-26 ENCOUNTER — Ambulatory Visit (INDEPENDENT_AMBULATORY_CARE_PROVIDER_SITE_OTHER): Payer: 59 | Admitting: Family Medicine

## 2021-02-26 ENCOUNTER — Encounter (INDEPENDENT_AMBULATORY_CARE_PROVIDER_SITE_OTHER): Payer: Self-pay | Admitting: Family Medicine

## 2021-02-26 ENCOUNTER — Other Ambulatory Visit: Payer: Self-pay

## 2021-02-26 VITALS — BP 110/70 | HR 80 | Temp 97.8°F | Ht 64.0 in | Wt >= 6400 oz

## 2021-02-26 DIAGNOSIS — R7301 Impaired fasting glucose: Secondary | ICD-10-CM

## 2021-02-26 DIAGNOSIS — Z6841 Body Mass Index (BMI) 40.0 and over, adult: Secondary | ICD-10-CM

## 2021-02-26 DIAGNOSIS — Z9189 Other specified personal risk factors, not elsewhere classified: Secondary | ICD-10-CM

## 2021-02-26 DIAGNOSIS — E559 Vitamin D deficiency, unspecified: Secondary | ICD-10-CM | POA: Diagnosis not present

## 2021-02-26 MED ORDER — VITAMIN D (ERGOCALCIFEROL) 1.25 MG (50000 UNIT) PO CAPS: 50000.0000 [IU] | ORAL_CAPSULE | ORAL | 0 refills | Status: DC

## 2021-02-26 MED ORDER — TIRZEPATIDE 5 MG/0.5ML ~~LOC~~ SOAJ: 5.0000 mg | SUBCUTANEOUS | 1 refills | Status: DC

## 2021-03-03 NOTE — Progress Notes (Signed)
Chief Complaint:   OBESITY Angela Tucker is here to discuss her progress with her obesity treatment plan along with follow-up of her obesity related diagnoses.   Today's visit was #: 31 Starting weight: 414 lbs Starting date: 09/19/2019 Today's weight: 405 lbs Today's date: 02/26/2021 Weight change since last visit: 3 lbs Total lbs lost to date: 9 lbs Body mass index is 69.52 kg/m.  Total weight loss percentage to date: -2.17%  Current Meal Plan: the Category 4 Plan for 75% of the time.  Current Exercise Plan: None. Current Anti-Obesity Medications: Mounjaro 2.5 mg subcutaneously weekly and phentermine 18.75-37.5 mg daily. Side effects: None.  Interim History:  Angela Tucker says she is tolerating Mounjaro with no side effects.  She says phentermine makes her too jittery.  Assessment/Plan:   1. Vitamin D deficiency Not at goal.  She is taking vitamin D 50,000 IU weekly.  Plan: Continue to take prescription Vitamin D '@50'$ ,000 IU every week as prescribed.  Follow-up for routine testing of Vitamin D, at least 2-3 times per year to avoid over-replacement.  Lab Results  Component Value Date   VD25OH 22.3 (L) 09/24/2020   VD25OH 17.7 (L) 03/21/2020   VD25OH 21.4 (L) 10/03/2019   - Refill Vitamin D, Ergocalciferol, (DRISDOL) 1.25 MG (50000 UNIT) CAPS capsule; Take 1 capsule (50,000 Units total) by mouth every 7 (seven) days.  Dispense: 12 capsule; Refill: 0  2. Impaired fasting glucose, with polyphagia Will increase Mounjaro to 5 mg subcutaneously weekly and she will stop metformin.  - Increase and refill tirzepatide (MOUNJARO) 5 MG/0.5ML Pen; Inject 5 mg into the skin once a week.  Dispense: 6 mL; Refill: 1  3. At risk for heart disease Due to Pieper's current state of health and medical condition(s), she is at a higher risk for heart disease.  This puts the patient at much greater risk to subsequently develop cardiopulmonary conditions that can significantly affect patient's  quality of life in a negative manner.    At least 8 minutes were spent on counseling [name] about these concerns today. Evidence-based interventions for health behavior change were utilized today including the discussion of self monitoring techniques, problem-solving barriers, and SMART goal setting techniques.  Specifically, regarding patient's less desirable eating habits and patterns, we employed the technique of small changes when [name] has not been able to fully commit to her prudent nutritional plan.  4. Obesity with current BMI of 69.5  Course: [name] is currently in the action stage of change. As such, her goal is to continue with weight loss efforts.   Nutrition goals: She has agreed to practicing portion control and making smarter food choices, such as increasing vegetables and decreasing simple carbohydrates. Bariatric plate was discussed.  Exercise goals: All adults should avoid inactivity. Some physical activity is better than none, and adults who participate in any amount of physical activity gain some health benefits.  Behavioral modification strategies: increasing lean protein intake, decreasing simple carbohydrates, increasing vegetables, and increasing water intake.  [name] has agreed to follow-up with our clinic in 4 weeks. She was informed of the importance of frequent follow-up visits to maximize her success with intensive lifestyle modifications for her multiple health conditions.   Objective:   Blood pressure 110/70, pulse 80, temperature 97.8 F (36.6 C), temperature source Oral, height '5\' 4"'$  (1.626 m), weight (!) 405 lb (183.7 kg), SpO2 97 %. Body mass index is 69.52 kg/m.  General: Cooperative, alert, well developed, in no acute distress. HEENT: Conjunctivae and lids  unremarkable. Cardiovascular: Regular rhythm.  Lungs: Normal work of breathing. Neurologic: No focal deficits.   Lab Results  Component Value Date   CREATININE 0.90 09/24/2020   BUN  10 09/24/2020   NA 142 09/24/2020   K 5.1 09/24/2020   CL 105 09/24/2020   CO2 22 09/24/2020   Lab Results  Component Value Date   ALT 24 09/24/2020   AST 13 09/24/2020   ALKPHOS 96 09/24/2020   BILITOT 0.3 09/24/2020   Lab Results  Component Value Date   HGBA1C 5.6 09/24/2020   HGBA1C 5.8 (H) 03/21/2020   HGBA1C 5.8 (H) 10/03/2019   HGBA1C 5.7 02/19/2016   HGBA1C 6.4 (H) 08/29/2014   Lab Results  Component Value Date   INSULIN 15.3 09/24/2020   INSULIN 13.1 10/03/2019   Lab Results  Component Value Date   TSH 1.460 09/24/2020   Lab Results  Component Value Date   CHOL 183 09/24/2020   HDL 39 (L) 09/24/2020   LDLCALC 132 (H) 09/24/2020   TRIG 66 09/24/2020   CHOLHDL 4.7 (H) 09/24/2020   Lab Results  Component Value Date   VD25OH 22.3 (L) 09/24/2020   VD25OH 17.7 (L) 03/21/2020   VD25OH 21.4 (L) 10/03/2019   Lab Results  Component Value Date   WBC 10.2 09/24/2020   HGB 10.9 (L) 09/24/2020   HCT 35.5 09/24/2020   MCV 78 (L) 09/24/2020   PLT 542 (H) 09/24/2020   Lab Results  Component Value Date   IRON 23 (L) 09/24/2020   TIBC 317 09/24/2020   FERRITIN 25 09/24/2020   Attestation Statements:   Reviewed by clinician on day of visit: allergies, medications, problem list, medical history, surgical history, family history, social history, and previous encounter notes.  I, Water quality scientist, CMA, am acting as transcriptionist for Briscoe Deutscher, DO  I have reviewed the above documentation for accuracy and completeness, and I agree with the above. Briscoe Deutscher, DO

## 2021-03-04 ENCOUNTER — Encounter (INDEPENDENT_AMBULATORY_CARE_PROVIDER_SITE_OTHER): Payer: Self-pay | Admitting: Family Medicine

## 2021-03-05 ENCOUNTER — Encounter (INDEPENDENT_AMBULATORY_CARE_PROVIDER_SITE_OTHER): Payer: Self-pay

## 2021-03-19 DIAGNOSIS — N920 Excessive and frequent menstruation with regular cycle: Secondary | ICD-10-CM | POA: Insufficient documentation

## 2021-04-03 ENCOUNTER — Ambulatory Visit (INDEPENDENT_AMBULATORY_CARE_PROVIDER_SITE_OTHER): Payer: 59 | Admitting: Family Medicine

## 2021-04-03 ENCOUNTER — Other Ambulatory Visit: Payer: Self-pay

## 2021-04-03 ENCOUNTER — Encounter (INDEPENDENT_AMBULATORY_CARE_PROVIDER_SITE_OTHER): Payer: Self-pay | Admitting: Family Medicine

## 2021-04-03 VITALS — BP 149/74 | HR 73 | Temp 97.7°F | Ht 64.0 in | Wt 399.0 lb

## 2021-04-03 DIAGNOSIS — F3289 Other specified depressive episodes: Secondary | ICD-10-CM | POA: Diagnosis not present

## 2021-04-03 DIAGNOSIS — R7301 Impaired fasting glucose: Secondary | ICD-10-CM

## 2021-04-03 DIAGNOSIS — E559 Vitamin D deficiency, unspecified: Secondary | ICD-10-CM

## 2021-04-03 DIAGNOSIS — Z9189 Other specified personal risk factors, not elsewhere classified: Secondary | ICD-10-CM

## 2021-04-03 DIAGNOSIS — L639 Alopecia areata, unspecified: Secondary | ICD-10-CM | POA: Insufficient documentation

## 2021-04-03 DIAGNOSIS — Z6841 Body Mass Index (BMI) 40.0 and over, adult: Secondary | ICD-10-CM

## 2021-04-07 ENCOUNTER — Encounter (INDEPENDENT_AMBULATORY_CARE_PROVIDER_SITE_OTHER): Payer: Self-pay

## 2021-04-07 ENCOUNTER — Encounter (INDEPENDENT_AMBULATORY_CARE_PROVIDER_SITE_OTHER): Payer: Self-pay | Admitting: Family Medicine

## 2021-04-07 MED ORDER — TIRZEPATIDE 5 MG/0.5ML ~~LOC~~ SOAJ: 5.0000 mg | SUBCUTANEOUS | 1 refills | Status: DC

## 2021-04-07 NOTE — Progress Notes (Signed)
Chief Complaint:   OBESITY Angela Tucker is here to discuss her progress with her obesity treatment plan along with follow-up of her obesity related diagnoses.   Today's visit was #: 58 Starting weight: 414 lbs Starting date: 09/19/2019 Today's weight: 399 lbs Today's date: 04/03/2021 Weight change since last visit: 6 lbs Total lbs lost to date: 15 lbs Body mass index is 68.49 kg/m.  Total weight loss percentage to date: -3.62%  Current Meal Plan: the Category 4 Plan for 65% of the time.  Current Exercise Plan: Increased walking. Current Anti-Obesity Medications: Mounjaro 5 mg subcutaneously weekly. Side effects: None.  Interim History:  Elvi says she had a difficulty time getting Mounjaro after her last visit.  She is tolerating it well with no side effects.  She denies polyphagia.  Assessment/Plan:   1. Impaired fasting glucose, with polyphagia Controlled. Current treatment: Mounjaro 5 mg subcutaneously weekly. She will continue to focus on protein-rich, low simple carbohydrate foods. We reviewed the importance of hydration, regular exercise for stress reduction, and restorative sleep.  Plan:  Continue Mounjaro 5 mg weekly.  Will refill today.  - Refill tirzepatide (MOUNJARO) 5 MG/0.5ML Pen; Inject 5 mg into the skin once a week.  Dispense: 2 mL; Refill: 1  2. Vitamin D deficiency Not at goal.  She is taking vitamin D 50,000 IU weekly.  Plan: Continue to take prescription Vitamin D @50 ,000 IU every week as prescribed.  Follow-up for routine testing of Vitamin D, at least 2-3 times per year to avoid over-replacement.  Lab Results  Component Value Date   VD25OH 22.3 (L) 09/24/2020   VD25OH 17.7 (L) 03/21/2020   VD25OH 21.4 (L) 10/03/2019   3. Other depression, with emotional eating Improved.  Medication: Wellbutrin 150 mg daily.  Plan:  Continue Wellbutrin.  Discussed cues and consequences, how thoughts affect eating, model of thoughts, feelings, and behaviors, and  strategies for change by focusing on the cue. Discussed cognitive distortions, coping thoughts, and how to change your thoughts.  4. At risk for heart disease Due to Sascha's current state of health and medical condition(s), she is at a higher risk for heart disease.  This puts the patient at much greater risk to subsequently develop cardiopulmonary conditions that can significantly affect patient's quality of life in a negative manner.    At least 8 minutes were spent on counseling Klani about these concerns today. Evidence-based interventions for health behavior change were utilized today including the discussion of self monitoring techniques, problem-solving barriers, and SMART goal setting techniques.  Specifically, regarding patient's less desirable eating habits and patterns, we employed the technique of small changes when Aikam has not been able to fully commit to her prudent nutritional plan.  5. Obesity with current BMI of 68.5  Course: Jodeen is currently in the action stage of change. As such, her goal is to continue with weight loss efforts.   Nutrition goals: She has agreed to the Category 4 Plan.   Exercise goals:  As is.  Behavioral modification strategies: increasing lean protein intake, decreasing simple carbohydrates, increasing vegetables, and increasing water intake.  Norinne has agreed to follow-up with our clinic in 4 weeks. She was informed of the importance of frequent follow-up visits to maximize her success with intensive lifestyle modifications for her multiple health conditions.   Objective:   Blood pressure (!) 149/74, pulse 73, temperature 97.7 F (36.5 C), temperature source Oral, height 5\' 4"  (1.626 m), weight (!) 399 lb (181 kg), SpO2 97 %.  Body mass index is 68.49 kg/m.  General: Cooperative, alert, well developed, in no acute distress. HEENT: Conjunctivae and lids unremarkable. Cardiovascular: Regular rhythm.  Lungs: Normal work of  breathing. Neurologic: No focal deficits.   Lab Results  Component Value Date   CREATININE 0.90 09/24/2020   BUN 10 09/24/2020   NA 142 09/24/2020   K 5.1 09/24/2020   CL 105 09/24/2020   CO2 22 09/24/2020   Lab Results  Component Value Date   ALT 24 09/24/2020   AST 13 09/24/2020   ALKPHOS 96 09/24/2020   BILITOT 0.3 09/24/2020   Lab Results  Component Value Date   HGBA1C 5.6 09/24/2020   HGBA1C 5.8 (H) 03/21/2020   HGBA1C 5.8 (H) 10/03/2019   HGBA1C 5.7 02/19/2016   HGBA1C 6.4 (H) 08/29/2014   Lab Results  Component Value Date   INSULIN 15.3 09/24/2020   INSULIN 13.1 10/03/2019   Lab Results  Component Value Date   TSH 1.460 09/24/2020   Lab Results  Component Value Date   CHOL 183 09/24/2020   HDL 39 (L) 09/24/2020   LDLCALC 132 (H) 09/24/2020   TRIG 66 09/24/2020   CHOLHDL 4.7 (H) 09/24/2020   Lab Results  Component Value Date   VD25OH 22.3 (L) 09/24/2020   VD25OH 17.7 (L) 03/21/2020   VD25OH 21.4 (L) 10/03/2019   Lab Results  Component Value Date   WBC 10.2 09/24/2020   HGB 10.9 (L) 09/24/2020   HCT 35.5 09/24/2020   MCV 78 (L) 09/24/2020   PLT 542 (H) 09/24/2020   Lab Results  Component Value Date   IRON 23 (L) 09/24/2020   TIBC 317 09/24/2020   FERRITIN 25 09/24/2020   Attestation Statements:   Reviewed by clinician on day of visit: allergies, medications, problem list, medical history, surgical history, family history, social history, and previous encounter notes.  I, Water quality scientist, CMA, am acting as transcriptionist for Briscoe Deutscher, DO  I have reviewed the above documentation for accuracy and completeness, and I agree with the above. Briscoe Deutscher, DO

## 2021-05-12 ENCOUNTER — Other Ambulatory Visit: Payer: Self-pay

## 2021-05-12 ENCOUNTER — Ambulatory Visit (INDEPENDENT_AMBULATORY_CARE_PROVIDER_SITE_OTHER): Payer: 59 | Admitting: Family Medicine

## 2021-05-12 VITALS — BP 126/80 | HR 76 | Temp 98.1°F | Ht 64.0 in | Wt 395.0 lb

## 2021-05-12 DIAGNOSIS — F3289 Other specified depressive episodes: Secondary | ICD-10-CM | POA: Diagnosis not present

## 2021-05-12 DIAGNOSIS — Z6841 Body Mass Index (BMI) 40.0 and over, adult: Secondary | ICD-10-CM | POA: Diagnosis not present

## 2021-05-12 DIAGNOSIS — R7301 Impaired fasting glucose: Secondary | ICD-10-CM

## 2021-05-12 MED ORDER — TIRZEPATIDE 5 MG/0.5ML ~~LOC~~ SOAJ: 5.0000 mg | SUBCUTANEOUS | 1 refills | Status: DC

## 2021-05-13 NOTE — Progress Notes (Signed)
Chief Complaint:   OBESITY Angela Tucker is here to discuss her progress with her obesity treatment plan along with follow-up of her obesity related diagnoses. See Medical Weight Management Flowsheet for complete bioelectrical impedance results.  Today's visit was #: 47 Starting weight: 414 lbs Starting date: 09/19/2019 Weight change since last visit: 4 lbs Total lbs lost to date: 19 lbs Total weight loss percentage to date: -4.59%  Nutrition Plan: Category 4 Meal Plan for 60% of the time. Activity: Walking for 15 minutes 3 times per week. Anti-obesity medications: Mounjaro 5 mg subcutaneously weekly. Reported side effects: None.  Interim History: Angela Tucker says she has had no Mounjaro for a month due to [employer].  She used metformin (had 750 mg daily).  Assessment/Plan:   1. Impaired fasting glucose, with polyphagia Controlled. Current treatment: Mounjaro 5 mg subcutaneously weekly.    Plan: She will continue to focus on protein-rich, low simple carbohydrate foods. We reviewed the importance of hydration, regular exercise for stress reduction, and restorative sleep.  - Refill tirzepatide (MOUNJARO) 5 MG/0.5ML Pen; Inject 5 mg into the skin once a week.  Dispense: 2 mL; Refill: 1  2. Other depression, with emotional eating Controlled. Medication: Wellbutrin 150 mg daily.   Plan:  Continue Wellbutrin.  Discussed cues and consequences, how thoughts affect eating, model of thoughts, feelings, and behaviors, and strategies for change by focusing on the cue. Discussed cognitive distortions, coping thoughts, and how to change your thoughts.  3. Class 3 severe obesity with serious comorbidity and body mass index (BMI) greater than or equal to 70 in adult, unspecified obesity type Angela Tucker)  Course: Angela Tucker is currently in the action stage of change. As such, her goal is to continue with weight loss efforts.   Nutrition goals: She has agreed to the Category 4 Plan.   Exercise goals:  For substantial health benefits, adults should do at least 150 minutes (2 hours and 30 minutes) a week of moderate-intensity, or 75 minutes (1 hour and 15 minutes) a week of vigorous-intensity aerobic physical activity, or an equivalent combination of moderate- and vigorous-intensity aerobic activity. Aerobic activity should be performed in episodes of at least 10 minutes, and preferably, it should be spread throughout the week.  Behavioral modification strategies: increasing lean protein intake, decreasing simple carbohydrates, increasing vegetables, and increasing water intake.  Angela Tucker has agreed to follow-up with our clinic in 4 weeks. She was informed of the importance of frequent follow-up visits to maximize her success with intensive lifestyle modifications for her multiple health conditions.   Objective:   Blood pressure 126/80, pulse 76, temperature 98.1 F (36.7 C), temperature source Oral, height 5\' 4"  (1.626 m), weight (!) 395 lb (179.2 kg), SpO2 99 %. Body mass index is 67.8 kg/m.  General: Cooperative, alert, well developed, in no acute distress. HEENT: Conjunctivae and lids unremarkable. Cardiovascular: Regular rhythm.  Lungs: Normal work of breathing. Neurologic: No focal deficits.   Lab Results  Component Value Date   CREATININE 0.90 09/24/2020   BUN 10 09/24/2020   NA 142 09/24/2020   K 5.1 09/24/2020   CL 105 09/24/2020   CO2 22 09/24/2020   Lab Results  Component Value Date   ALT 24 09/24/2020   AST 13 09/24/2020   ALKPHOS 96 09/24/2020   BILITOT 0.3 09/24/2020   Lab Results  Component Value Date   HGBA1C 5.6 09/24/2020   HGBA1C 5.8 (H) 03/21/2020   HGBA1C 5.8 (H) 10/03/2019   HGBA1C 5.7 02/19/2016   HGBA1C  6.4 (H) 08/29/2014   Lab Results  Component Value Date   INSULIN 15.3 09/24/2020   INSULIN 13.1 10/03/2019   Lab Results  Component Value Date   TSH 1.460 09/24/2020   Lab Results  Component Value Date   CHOL 183 09/24/2020   HDL 39 (L)  09/24/2020   LDLCALC 132 (H) 09/24/2020   TRIG 66 09/24/2020   CHOLHDL 4.7 (H) 09/24/2020   Lab Results  Component Value Date   VD25OH 22.3 (L) 09/24/2020   VD25OH 17.7 (L) 03/21/2020   VD25OH 21.4 (L) 10/03/2019   Lab Results  Component Value Date   WBC 10.2 09/24/2020   HGB 10.9 (L) 09/24/2020   HCT 35.5 09/24/2020   MCV 78 (L) 09/24/2020   PLT 542 (H) 09/24/2020   Lab Results  Component Value Date   IRON 23 (L) 09/24/2020   TIBC 317 09/24/2020   FERRITIN 25 09/24/2020   Attestation Statements:   Reviewed by clinician on day of visit: allergies, medications, problem list, medical history, surgical history, family history, social history, and previous encounter notes.  I, Water quality scientist, CMA, am acting as transcriptionist for Briscoe Deutscher, DO  I have reviewed the above documentation for accuracy and completeness, and I agree with the above. -  Briscoe Deutscher, DO, MS, FAAFP, DABOM - Family and Bariatric Medicine.

## 2021-05-24 ENCOUNTER — Encounter (INDEPENDENT_AMBULATORY_CARE_PROVIDER_SITE_OTHER): Payer: Self-pay | Admitting: Family Medicine

## 2021-06-12 ENCOUNTER — Ambulatory Visit (INDEPENDENT_AMBULATORY_CARE_PROVIDER_SITE_OTHER): Payer: 59 | Admitting: Family Medicine

## 2021-07-09 ENCOUNTER — Telehealth (INDEPENDENT_AMBULATORY_CARE_PROVIDER_SITE_OTHER): Payer: Self-pay

## 2021-07-09 ENCOUNTER — Other Ambulatory Visit (INDEPENDENT_AMBULATORY_CARE_PROVIDER_SITE_OTHER): Payer: Self-pay | Admitting: Family Medicine

## 2021-07-09 DIAGNOSIS — R7301 Impaired fasting glucose: Secondary | ICD-10-CM

## 2021-07-09 MED ORDER — TIRZEPATIDE 5 MG/0.5ML ~~LOC~~ SOAJ: 5.0000 mg | SUBCUTANEOUS | 0 refills | Status: DC

## 2021-07-09 NOTE — Telephone Encounter (Signed)
RX sent

## 2021-07-09 NOTE — Telephone Encounter (Signed)
Pt need a refill on her [facility] sent to [facility] on [address]. Please advise

## 2021-07-21 ENCOUNTER — Ambulatory Visit (INDEPENDENT_AMBULATORY_CARE_PROVIDER_SITE_OTHER): Payer: 59 | Admitting: Family Medicine

## 2021-07-21 ENCOUNTER — Other Ambulatory Visit: Payer: Self-pay

## 2021-07-21 ENCOUNTER — Encounter (INDEPENDENT_AMBULATORY_CARE_PROVIDER_SITE_OTHER): Payer: Self-pay | Admitting: Family Medicine

## 2021-07-21 VITALS — BP 105/69 | HR 70 | Temp 97.8°F | Ht 64.0 in | Wt 391.0 lb

## 2021-07-21 DIAGNOSIS — F3289 Other specified depressive episodes: Secondary | ICD-10-CM | POA: Diagnosis not present

## 2021-07-21 DIAGNOSIS — E559 Vitamin D deficiency, unspecified: Secondary | ICD-10-CM

## 2021-07-21 DIAGNOSIS — Z6841 Body Mass Index (BMI) 40.0 and over, adult: Secondary | ICD-10-CM

## 2021-07-21 DIAGNOSIS — R7301 Impaired fasting glucose: Secondary | ICD-10-CM

## 2021-07-22 MED ORDER — TIRZEPATIDE 7.5 MG/0.5ML ~~LOC~~ SOAJ
7.5000 mg | SUBCUTANEOUS | 1 refills | Status: DC
Start: 2021-07-22 — End: 2021-09-02

## 2021-07-23 NOTE — Progress Notes (Signed)
Chief Complaint:   OBESITY Angela Tucker is here to discuss her progress with her obesity treatment plan along with follow-up of her obesity related diagnoses. See Medical Weight Management Flowsheet for complete bioelectrical impedance results.  Today's visit was #: 20 Starting weight: 414 lbs Starting date: 09/19/2019 Weight change since last visit: 4 lbs Total lbs lost to date: 23 lbs Total weight loss percentage to date: -5.56%  Nutrition Plan: Category 4 Plan for 0% of the time.  Activity: Increased walking/activity. Anti-obesity medications: Mounjaro 5 mg subcutaneously weekly. Reported side effects: None.  Interim History: Angela Tucker has gone 2 weeks without shots.  She says she is not drinking enough water.  Assessment/Plan:   1. Impaired fasting glucose, with polyphagia Angela Tucker is taking Mounjaro 5 mg subcutaneously weekly.  Plan:  Increase Mounjaro to 7.5 mg subcutaneously weekly, as per below.  - Increase tirzepatide (MOUNJARO) 7.5 MG/0.5ML Pen; Inject 7.5 mg into the skin once a week.  Dispense: 2 mL; Refill: 1  2. Vitamin D deficiency Not at goal. She is taking vitamin D 50,000 IU weekly.  Plan: Continue to take prescription Vitamin D @50 ,000 IU every week as prescribed.  Follow-up for routine testing of Vitamin D, at least 2-3 times per year to avoid over-replacement.  Lab Results  Component Value Date   VD25OH 22.3 (L) 09/24/2020   VD25OH 17.7 (L) 03/21/2020   VD25OH 21.4 (L) 10/03/2019   3. Other depression, with emotional eating Controlled. Medication: Wellbutrin 150 mg daily.   Plan:  Continue Wellbutrin at current dose. Discussed cues and consequences, how thoughts affect eating, model of thoughts, feelings, and behaviors, and strategies for change by focusing on the cue. Discussed cognitive distortions, coping thoughts, and how to change your thoughts.  4. Obesity, current BMI 67.3  Course: Angela Tucker is currently in the action stage of change. As  such, her goal is to continue with weight loss efforts.   Nutrition goals: She has agreed to practicing portion control and making smarter food choices, such as increasing vegetables and decreasing simple carbohydrates.   Exercise goals:  As is.  Behavioral modification strategies: increasing water intake.  Angela Tucker has agreed to follow-up with our clinic in 4 weeks. She was informed of the importance of frequent follow-up visits to maximize her success with intensive lifestyle modifications for her multiple health conditions.   Objective:   Blood pressure 105/69, pulse 70, temperature 97.8 F (36.6 C), temperature source Oral, height 5\' 4"  (1.626 m), weight (!) 391 lb (177.4 kg), SpO2 98 %. Body mass index is 67.11 kg/m.  General: Cooperative, alert, well developed, in no acute distress. HEENT: Conjunctivae and lids unremarkable. Cardiovascular: Regular rhythm.  Lungs: Normal work of breathing. Neurologic: No focal deficits.   Lab Results  Component Value Date   CREATININE 0.90 09/24/2020   BUN 10 09/24/2020   NA 142 09/24/2020   K 5.1 09/24/2020   CL 105 09/24/2020   CO2 22 09/24/2020   Lab Results  Component Value Date   ALT 24 09/24/2020   AST 13 09/24/2020   ALKPHOS 96 09/24/2020   BILITOT 0.3 09/24/2020   Lab Results  Component Value Date   HGBA1C 5.6 09/24/2020   HGBA1C 5.8 (H) 03/21/2020   HGBA1C 5.8 (H) 10/03/2019   HGBA1C 5.7 02/19/2016   HGBA1C 6.4 (H) 08/29/2014   Lab Results  Component Value Date   INSULIN 15.3 09/24/2020   INSULIN 13.1 10/03/2019   Lab Results  Component Value Date   TSH 1.460  09/24/2020   Lab Results  Component Value Date   CHOL 183 09/24/2020   HDL 39 (L) 09/24/2020   LDLCALC 132 (H) 09/24/2020   TRIG 66 09/24/2020   CHOLHDL 4.7 (H) 09/24/2020   Lab Results  Component Value Date   VD25OH 22.3 (L) 09/24/2020   VD25OH 17.7 (L) 03/21/2020   VD25OH 21.4 (L) 10/03/2019   Lab Results  Component Value Date   WBC 10.2  09/24/2020   HGB 10.9 (L) 09/24/2020   HCT 35.5 09/24/2020   MCV 78 (L) 09/24/2020   PLT 542 (H) 09/24/2020   Lab Results  Component Value Date   IRON 23 (L) 09/24/2020   TIBC 317 09/24/2020   FERRITIN 25 09/24/2020   Attestation Statements:   Reviewed by clinician on day of visit: allergies, medications, problem list, medical history, surgical history, family history, social history, and previous encounter notes.  I, Water quality scientist, CMA, am acting as transcriptionist for Briscoe Deutscher, DO  I have reviewed the above documentation for accuracy and completeness, and I agree with the above. -  Briscoe Deutscher, DO, MS, FAAFP, DABOM - Family and Bariatric Medicine.

## 2021-08-06 ENCOUNTER — Ambulatory Visit (INDEPENDENT_AMBULATORY_CARE_PROVIDER_SITE_OTHER): Payer: 59 | Admitting: Family

## 2021-08-06 ENCOUNTER — Encounter: Payer: Self-pay | Admitting: Family

## 2021-08-06 ENCOUNTER — Encounter: Payer: 59 | Admitting: Family

## 2021-08-06 VITALS — BP 125/70 | HR 85 | Temp 98.4°F | Resp 16 | Ht 64.0 in | Wt 395.0 lb

## 2021-08-06 DIAGNOSIS — I10 Essential (primary) hypertension: Secondary | ICD-10-CM

## 2021-08-06 DIAGNOSIS — R7303 Prediabetes: Secondary | ICD-10-CM

## 2021-08-06 DIAGNOSIS — Z1211 Encounter for screening for malignant neoplasm of colon: Secondary | ICD-10-CM

## 2021-08-06 DIAGNOSIS — Z0000 Encounter for general adult medical examination without abnormal findings: Secondary | ICD-10-CM

## 2021-08-06 DIAGNOSIS — Z1159 Encounter for screening for other viral diseases: Secondary | ICD-10-CM

## 2021-08-06 DIAGNOSIS — Z23 Encounter for immunization: Secondary | ICD-10-CM

## 2021-08-06 DIAGNOSIS — E7849 Other hyperlipidemia: Secondary | ICD-10-CM

## 2021-08-06 DIAGNOSIS — G4733 Obstructive sleep apnea (adult) (pediatric): Secondary | ICD-10-CM

## 2021-08-06 LAB — HEPATITIS C ANTIBODY
Hepatitis C Ab: NONREACTIVE
SIGNAL TO CUT-OFF: <0.02 (ref <1.00)

## 2021-08-06 NOTE — Assessment & Plan Note (Signed)
. °  BP Readings from Last 3 Encounters:  08/06/21 125/70  07/21/21 105/69  05/12/21 126/80

## 2021-08-06 NOTE — Assessment & Plan Note (Signed)
Lab Results  Component Value Date   CHOL 183 09/24/2020   HDL 39 (L) 09/24/2020   LDLCALC 132 (H) 09/24/2020   TRIG 66 09/24/2020   CHOLHDL 4.7 (H) 09/24/2020

## 2021-08-06 NOTE — Addendum Note (Signed)
Addended by: Jiles Prows on: 08/06/2021 10:40 AM   Modules accepted: Orders

## 2021-08-06 NOTE — Assessment & Plan Note (Addendum)
Encouraged pt to continue her work on [employer], exercise and weight loss. She is currently being treated by healthy weight and wellness with Mounjaro. Refer for colonoscopy. She will update pap and mammo tomorrow with GYN. Shingrix #1 today. Recommended that she obtain covid bivalent booster at the pharmacy. Declines flu shot.

## 2021-08-06 NOTE — Progress Notes (Signed)
Subjective:   By signing my name below, I, Zite Okoli, attest that this documentation has been prepared under the direction and in the presence of Debbrah Alar, NP 08/06/2021    Patient ID: Angela Tucker, female    DOB: March 26, 1971, 51 y.o.   MRN: [id number]  Chief Complaint  Patient presents with   Annual Exam         HPI Patient is in today for a comprehensive physical exam.  Weight Loss- She has been working with the [employer] [facility] program since 2021 and has lost about 23 pounds. She is reducing her food portions and is trying to exercise more.  Mammogram- Checks every year with OBGYN. Normal results. Will do tomorrow.  Pap Smear- Checks with OBGYN. Normal results. Still gets menstrual cycles and is using birth control to regulate them.   Hypertension- Her blood pressure is stable at today's visit. BP Readings from Last 3 Encounters:  08/06/21 125/70  07/21/21 105/69  05/12/21 126/80    Hyperlipidemia-  Her cholesterol levels were checked on 01/09. LDL-102, Triglycerides -74  OSA- She does not use her CPAP machine anymore.  Immunizations-  She will receive the first dose of the shingles vaccine today. She has 2 Covid-19 vaccines at this time. UTD on tetanus. She is not interested in the flu vaccine.  Social History-  No recent surgeries. No changes in family medical history. She does not smoke or use tobacco products. She is sexually active.   She denies having any unexpected weight change, ear pain, hearing loss and rhinorrhea, visual disturbance, cough, chest pain and leg swelling, nausea, vomiting, diarrhea and blood in stool, or dysuria and frequency, for myalgias, rash, headaches, adenopathy, depression or anxiety at this time   Past Medical History:  Diagnosis Date   Anemia    Gout    HLD (hyperlipidemia)    Hypertension    Lower extremity edema    OSA (obstructive sleep apnea)     Past Surgical History:  Procedure Laterality Date    CESAREAN SECTION     x 3   TUBAL LIGATION      Family History  Problem Relation Age of Onset   Cancer Mother 72       breast   Hypertension Father    Diabetes Father    Diabetes Paternal Grandmother    Hypertension Paternal Grandmother    Diabetes Paternal Grandfather    Hypertension Paternal Grandfather     Social History   Socioeconomic History   Marital status: Married    Spouse name: Angela Tucker   Number of children: Not on file   Years of education: Not on file   Highest education level: Not on file  Occupational History   Not on file  Tobacco Use   Smoking status: Never   Smokeless tobacco: Never  Substance and Sexual Activity   Alcohol use: Yes    Alcohol/week: 0.0 standard drinks    Comment: occasional glass of wine   Drug use: No   Sexual activity: Yes  Other Topics Concern   Not on file  Social History Narrative   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, learning disability   Married   3 children   Born 1997- Son Angela Tucker   2002- Angela Tucker daughter   2004- Angela Tucker son   Masters in [employer]   Social Determinants of Radio broadcast assistant Strain: Not on [employer] Insecurity: Not on file  Transportation Needs: Not on  file  Physical Activity: Not on file  Stress: Not on file  Social Connections: Not on file  Intimate Partner Violence: Not on file    Outpatient Medications Prior to Visit  Medication Sig Dispense Refill   Norethindrone-Ethinyl Estradiol-Fe Biphas (LO LOESTRIN FE) 1 MG-10 MCG / 10 MCG tablet Take 1 tablet by mouth daily.     tirzepatide (MOUNJARO) 7.5 MG/0.5ML Pen Inject 7.5 mg into the skin once a week. 2 mL 1   buPROPion (WELLBUTRIN SR) 150 MG 12 hr tablet Take 1 tablet (150 mg total) by mouth daily. (Patient not taking: Reported on 08/06/2021) 90 tablet 0   Prenatal Vit-Fe Fumarate-FA (PRENATAL MULTIVITAMIN) TABS tablet Take 1 tablet by mouth daily at 12 noon.     Vitamin D, Ergocalciferol, (DRISDOL) 1.25 MG (50000 UNIT) CAPS capsule  Take 1 capsule (50,000 Units total) by mouth every 7 (seven) days. 12 capsule 0   No facility-administered medications prior to visit.    Allergies  Allergen Reactions   Atorvastatin Nausea Only   Penicillins Hives    Review of Systems  Constitutional:  Negative for fever.  HENT:  Negative for ear pain and hearing loss.        (-)nystagmus (-)adenopathy  Eyes:  Negative for blurred vision.  Respiratory:  Negative for cough, shortness of breath and wheezing.   Cardiovascular:  Negative for chest pain and leg swelling.  Gastrointestinal:  Negative for blood in stool, diarrhea, nausea and vomiting.  Genitourinary:  Negative for dysuria and frequency.  Musculoskeletal:  Positive for joint pain (knees). Negative for myalgias.  Skin:  Negative for rash.  Neurological:  Negative for headaches.  Psychiatric/Behavioral:  Negative for depression. The patient is not nervous/anxious.       Objective:    Physical Exam Constitutional:      General: She is not in acute distress.    Appearance: Normal appearance. She is obese. She is not ill-appearing.  HENT:     Head: Normocephalic and atraumatic.     Right Ear: Tympanic membrane, ear canal and external ear normal.     Left Ear: Tympanic membrane, ear canal and external ear normal.  Eyes:     Extraocular Movements: Extraocular movements intact.     Pupils: Pupils are equal, round, and reactive to light.  Cardiovascular:     Rate and Rhythm: Normal rate and regular rhythm.     Pulses: Normal pulses.     Heart sounds: Normal heart sounds. No murmur heard. Pulmonary:     Effort: Pulmonary effort is normal. No respiratory distress.     Breath sounds: Normal breath sounds. No wheezing or rhonchi.  Abdominal:     General: Bowel sounds are normal. There is no distension.     Palpations: Abdomen is soft.     Tenderness: There is no abdominal tenderness. There is no guarding or rebound.  Musculoskeletal:     Cervical back: Neck supple.      Comments: 5/5 strength in upper and lower extremities   Lymphadenopathy:     Cervical: No cervical adenopathy.  Skin:    General: Skin is warm and dry.     Comments: Absence of eyebrows/eyelashes  Neurological:     Mental Status: She is alert and oriented to person, place, and time.  Psychiatric:        Behavior: Behavior normal.        Judgment: Judgment normal.    BP 125/70 (BP Location: Right Arm, Patient Position: Sitting, Cuff Size: Large)  Pulse 85    Temp 98.4 F (36.9 C) (Oral)    Resp 16    Ht 5' 4"  (1.626 m)    Wt (!) 395 lb (179.2 kg)    SpO2 99%    BMI 67.80 kg/m  Wt Readings from Last 3 Encounters:  08/06/21 (!) 395 lb (179.2 kg)  07/21/21 (!) 391 lb (177.4 kg)  05/12/21 (!) 395 lb (179.2 kg)    Diabetic Foot Exam - Simple   No data filed    Lab Results  Component Value Date   WBC 10.2 09/24/2020   HGB 10.9 (L) 09/24/2020   HCT 35.5 09/24/2020   PLT 542 (H) 09/24/2020   GLUCOSE 94 09/24/2020   CHOL 183 09/24/2020   TRIG 66 09/24/2020   HDL 39 (L) 09/24/2020   LDLCALC 132 (H) 09/24/2020   ALT 24 09/24/2020   AST 13 09/24/2020   NA 142 09/24/2020   K 5.1 09/24/2020   CL 105 09/24/2020   CREATININE 0.90 09/24/2020   BUN 10 09/24/2020   CO2 22 09/24/2020   TSH 1.460 09/24/2020   INR 1.02 08/28/2014   HGBA1C 5.6 09/24/2020    Lab Results  Component Value Date   TSH 1.460 09/24/2020   Lab Results  Component Value Date   WBC 10.2 09/24/2020   HGB 10.9 (L) 09/24/2020   HCT 35.5 09/24/2020   MCV 78 (L) 09/24/2020   PLT 542 (H) 09/24/2020   Lab Results  Component Value Date   NA 142 09/24/2020   K 5.1 09/24/2020   CO2 22 09/24/2020   GLUCOSE 94 09/24/2020   BUN 10 09/24/2020   CREATININE 0.90 09/24/2020   BILITOT 0.3 09/24/2020   ALKPHOS 96 09/24/2020   AST 13 09/24/2020   ALT 24 09/24/2020   PROT 6.3 09/24/2020   ALBUMIN 3.8 09/24/2020   CALCIUM 9.4 09/24/2020   ANIONGAP 6 11/17/2016   EGFR 78 09/24/2020   GFR 88.46 03/21/2019    Lab Results  Component Value Date   CHOL 183 09/24/2020   Lab Results  Component Value Date   HDL 39 (L) 09/24/2020   Lab Results  Component Value Date   LDLCALC 132 (H) 09/24/2020   Lab Results  Component Value Date   TRIG 66 09/24/2020   Lab Results  Component Value Date   CHOLHDL 4.7 (H) 09/24/2020   Lab Results  Component Value Date   HGBA1C 5.6 09/24/2020       Assessment & Plan:   Problem List Items Addressed This Visit       Unprioritized   OSA (obstructive sleep apnea)    She is not on cpap.  Was mild on previous sleep study. Continue weight loss efforts.       Hyperlipidemia    Lab Results  Component Value Date   CHOL 183 09/24/2020   HDL 39 (L) 09/24/2020   LDLCALC 132 (H) 09/24/2020   TRIG 66 09/24/2020   CHOLHDL 4.7 (H) 09/24/2020        Essential hypertension    . BP Readings from Last 3 Encounters:  08/06/21 125/70  07/21/21 105/69  05/12/21 126/80        Encounter for preventive care - Primary    Encouraged pt to continue her work on [employer], exercise and weight loss. She is currently being treated by healthy weight and wellness with Mounjaro. Refer for colonoscopy. She will update pap and mammo tomorrow with GYN. Shingrix #1 today. Recommended that she obtain covid  bivalent booster at the pharmacy. Declines flu shot.       Borderline diabetes    Lab Results  Component Value Date   HGBA1C 5.6 09/24/2020        Other Visit Diagnoses     Colon cancer screening       Relevant Orders   Ambulatory referral to Gastroenterology   Need for hepatitis C screening test       Relevant Orders   Hepatitis C Antibody        No orders of the defined types were placed in this encounter.   I,Zite Okoli,acting as a Education administrator for [employer], NP.,have documented all relevant documentation on the behalf of Angela Pear, NP,as directed by  Angela Pear, NP while in the presence of Angela Pear, NP.    I, Debbrah Alar, NP , personally preformed the services described in this documentation.  All medical record entries made by the scribe were at my direction and in my presence.  I have reviewed the chart and discharge instructions (if applicable) and agree that the record reflects my personal performance and is accurate and complete. 08/06/2021

## 2021-08-06 NOTE — Assessment & Plan Note (Addendum)
She is not on cpap.  Was mild on previous sleep study. Continue weight loss efforts.

## 2021-08-06 NOTE — Assessment & Plan Note (Signed)
Lab Results  Component Value Date   HGBA1C 5.6 09/24/2020

## 2021-08-06 NOTE — Assessment & Plan Note (Signed)
She is on OCP's for this. Followed by [name].

## 2021-09-02 ENCOUNTER — Ambulatory Visit (INDEPENDENT_AMBULATORY_CARE_PROVIDER_SITE_OTHER): Payer: 59 | Admitting: Family Medicine

## 2021-09-02 ENCOUNTER — Encounter (INDEPENDENT_AMBULATORY_CARE_PROVIDER_SITE_OTHER): Payer: Self-pay | Admitting: Family Medicine

## 2021-09-02 ENCOUNTER — Other Ambulatory Visit: Payer: Self-pay

## 2021-09-02 VITALS — BP 120/76 | HR 74 | Temp 98.2°F | Ht 64.0 in | Wt 384.0 lb

## 2021-09-02 DIAGNOSIS — G478 Other sleep disorders: Secondary | ICD-10-CM | POA: Diagnosis not present

## 2021-09-02 DIAGNOSIS — E669 Obesity, unspecified: Secondary | ICD-10-CM

## 2021-09-02 DIAGNOSIS — Z6841 Body Mass Index (BMI) 40.0 and over, adult: Secondary | ICD-10-CM

## 2021-09-02 DIAGNOSIS — E8881 Metabolic syndrome: Secondary | ICD-10-CM

## 2021-09-02 DIAGNOSIS — R7301 Impaired fasting glucose: Secondary | ICD-10-CM

## 2021-09-02 MED ORDER — TIRZEPATIDE 7.5 MG/0.5ML ~~LOC~~ SOAJ: 7.5000 mg | SUBCUTANEOUS | 1 refills | Status: DC

## 2021-09-02 MED ORDER — TIRZEPATIDE 10 MG/0.5ML ~~LOC~~ SOAJ: 10.0000 mg | SUBCUTANEOUS | 1 refills | Status: DC

## 2021-09-04 NOTE — Progress Notes (Signed)
Chief Complaint:   OBESITY Angela Tucker is here to discuss her progress with her obesity treatment plan along with follow-up of her obesity related diagnoses. See Medical Weight Management Flowsheet for complete bioelectrical impedance results.  Today's visit was #: 21 Starting weight: 414 lbs Starting date: 09/19/2019 Weight change since last visit: 7 lbs Total lbs lost to date: 30 lbs Total weight loss percentage to date: -7.25%  Nutrition Plan: Practicing portion control and making smarter food choices, such as increasing vegetables and decreasing simple carbohydrates for 100% of the time. Activity: Walking more. Anti-obesity medications: Mounjaro 7.5 mg subcutaneously weekly. Reported side effects: None.  Interim History:  Angela Tucker says her sleep is poor.  She wakes every night at ~3 am.  She admits to using the phone when she wakes.  She estimates 6 hours of sleep/night.  Assessment/Plan:   1. Impaired fasting glucose, with polyphagia Improving, but not optimized. Current treatment: Mounjaro 7.5 mg subcutaneously weekly.    Plan: Continue Mounjaro 7.5 mg subcutaneously weekly.  Will refill today, as per below.  Will also send in Mounjaro 10 mg weekly dose. She will continue to focus on protein-rich, low simple carbohydrate foods. We reviewed the importance of hydration, regular exercise for stress reduction, and restorative sleep.  - Refill tirzepatide (MOUNJARO) 7.5 MG/0.5ML Pen; Inject 7.5 mg into the skin once a week.  Dispense: 6 mL; Refill: 1 - tirzepatide (MOUNJARO) 10 MG/0.5ML Pen; Inject 10 mg into the skin once a week.  Dispense: 6 mL; Refill: 1  2. Poor sleep pattern This is poorly controlled. Dysfunction: early morning awakening and non-restful sleep. Average hours of sleep per night: 6. Current treatment: None.  Plan: Recommend sleep hygiene measures including regular sleep schedule, optimal sleep environment, and relaxing presleep rituals.   3. Metabolic  syndrome Starting goal: Lose 7-10% of starting weight. She will continue to focus on protein-rich, low simple carbohydrate foods. We reviewed the importance of hydration, regular exercise for stress reduction, and restorative sleep.  We will continue to check lab work every 3 months, with 10% weight loss, or should any other concerns arise.  4. Obesity, current BMI 66.1  Course: Angela Tucker is currently in the action stage of change. As such, her goal is to continue with weight loss efforts.   Nutrition goals: She has agreed to practicing portion control and making smarter food choices, such as increasing vegetables and decreasing simple carbohydrates.   Exercise goals:  As is.  Behavioral modification strategies: increasing lean protein intake, decreasing simple carbohydrates, and increasing vegetables.  Angela Tucker has agreed to follow-up with our clinic in 4 weeks. She was informed of the importance of frequent follow-up visits to maximize her success with intensive lifestyle modifications for her multiple health conditions.   Objective:   Blood pressure 120/76, pulse 74, temperature 98.2 F (36.8 C), temperature source Oral, height 5\' 4"  (1.626 m), weight (!) 384 lb (174.2 kg), SpO2 96 %. Body mass index is 65.91 kg/m.  General: Cooperative, alert, well developed, in no acute distress. HEENT: Conjunctivae and lids unremarkable. Cardiovascular: Regular rhythm.  Lungs: Normal work of breathing. Neurologic: No focal deficits.   Lab Results  Component Value Date   CREATININE 0.90 09/24/2020   BUN 10 09/24/2020   NA 142 09/24/2020   K 5.1 09/24/2020   CL 105 09/24/2020   CO2 22 09/24/2020   Lab Results  Component Value Date   ALT 24 09/24/2020   AST 13 09/24/2020   ALKPHOS 96 09/24/2020  BILITOT 0.3 09/24/2020   Lab Results  Component Value Date   HGBA1C 5.6 09/24/2020   HGBA1C 5.8 (H) 03/21/2020   HGBA1C 5.8 (H) 10/03/2019   HGBA1C 5.7 02/19/2016   HGBA1C 6.4 (H)  08/29/2014   Lab Results  Component Value Date   INSULIN 15.3 09/24/2020   INSULIN 13.1 10/03/2019   Lab Results  Component Value Date   TSH 1.460 09/24/2020   Lab Results  Component Value Date   CHOL 183 09/24/2020   HDL 39 (L) 09/24/2020   LDLCALC 132 (H) 09/24/2020   TRIG 66 09/24/2020   CHOLHDL 4.7 (H) 09/24/2020   Lab Results  Component Value Date   VD25OH 22.3 (L) 09/24/2020   VD25OH 17.7 (L) 03/21/2020   VD25OH 21.4 (L) 10/03/2019   Lab Results  Component Value Date   WBC 10.2 09/24/2020   HGB 10.9 (L) 09/24/2020   HCT 35.5 09/24/2020   MCV 78 (L) 09/24/2020   PLT 542 (H) 09/24/2020   Lab Results  Component Value Date   IRON 23 (L) 09/24/2020   TIBC 317 09/24/2020   FERRITIN 25 09/24/2020   Attestation Statements:   Reviewed by clinician on day of visit: allergies, medications, problem list, medical history, surgical history, family history, social history, and previous encounter notes.  I, Water quality scientist, CMA, am acting as transcriptionist for Briscoe Deutscher, DO  I have reviewed the above documentation for accuracy and completeness, and I agree with the above. -  Briscoe Deutscher, DO, MS, FAAFP, DABOM - Family and Bariatric Medicine.

## 2021-09-12 ENCOUNTER — Telehealth: Payer: Self-pay

## 2021-09-12 NOTE — Telephone Encounter (Signed)
Reschdule as OV. Thanks.  ?

## 2021-09-12 NOTE — Telephone Encounter (Signed)
Dr.Cirigliano,  ?This patient has been scheduled for a PV on 09/26/2021 as a direct. However, the last BMI was taken on 09/02/2021 and found to be 65.88. ?Please advise if you would like the patient to be rescheduled for an OV or scheduled as a direct at [facility]? ?

## 2021-09-15 NOTE — Telephone Encounter (Signed)
Please see previous message, contact patient, and reschedule patient for an OV. ?Thank you

## 2021-09-30 ENCOUNTER — Other Ambulatory Visit: Payer: Self-pay

## 2021-09-30 ENCOUNTER — Ambulatory Visit (INDEPENDENT_AMBULATORY_CARE_PROVIDER_SITE_OTHER): Payer: 59 | Admitting: Gastroenterology

## 2021-09-30 ENCOUNTER — Encounter: Payer: Self-pay | Admitting: Gastroenterology

## 2021-09-30 VITALS — BP 130/82 | HR 84 | Ht 64.0 in | Wt 393.0 lb

## 2021-09-30 DIAGNOSIS — Z1211 Encounter for screening for malignant neoplasm of colon: Secondary | ICD-10-CM | POA: Diagnosis not present

## 2021-09-30 DIAGNOSIS — E559 Vitamin D deficiency, unspecified: Secondary | ICD-10-CM

## 2021-09-30 DIAGNOSIS — Z6841 Body Mass Index (BMI) 40.0 and over, adult: Secondary | ICD-10-CM

## 2021-09-30 DIAGNOSIS — G4733 Obstructive sleep apnea (adult) (pediatric): Secondary | ICD-10-CM

## 2021-09-30 DIAGNOSIS — D509 Iron deficiency anemia, unspecified: Secondary | ICD-10-CM | POA: Diagnosis not present

## 2021-09-30 NOTE — Progress Notes (Signed)
? ? ?          ?Chief Complaint: Colon cancer screening ? ? ?Referring Provider:     Debbrah Alar, NP ? ? ?HPI:   ? ? ?Angela Tucker is a 51 y.o. female with a history of HTN, HLD, OSA, gout, obesity (BMI 67.5), cesarean x3, referred to the Gastroenterology Clinic for evaluation of colon cancer screening. ? ?Reviewed most recent labs from 09/2020 notable for the following: ?- Hgb 10.9 with MCV/RDW 78/14.7.  Baseline Hgb 11.1-12.3 ?- Ferritin 25, iron 23, TIBC 317, sat 7% ?- Vitamin D 22.3 ; Took ergocalciferol ?- Normal CMP, B12 ? ?Evaluation for anemia in 09/2019 notable for the following: ?- Hgb 12.3, MCV/RDW 77/16.6 ?- Normal CMP ?- Normal B12, folate ?- Ferritin 62, iron 37, TIBC 285, sat 13% ? ?She took oral iron for 6 months or so, then stopped. No hx of IV iron, blood transfusion.  ? ?Otherwise, no GI sxs.  No hematochezia or melena.  No previous EGD or colonoscopy.  ? ?No known family history of CRC, GI malignancy, liver disease, pancreatic disease, or IBD.  ? ? ?Past Medical History:  ?Diagnosis Date  ? Anemia   ? Gout   ? HLD (hyperlipidemia)   ? Hypertension   ? Lower extremity edema   ? OSA (obstructive sleep apnea)   ? ? ? ?Past Surgical History:  ?Procedure Laterality Date  ? CESAREAN SECTION    ? x 3  ? TUBAL LIGATION    ? ?Family History  ?Problem Relation Age of Onset  ? Cancer Mother 72  ?     breast  ? Hypertension Father   ? Diabetes Father   ? Diabetes Paternal Grandmother   ? Hypertension Paternal Grandmother   ? Diabetes Paternal Grandfather   ? Hypertension Paternal Grandfather   ? ?Social History  ? ?Tobacco Use  ? Smoking status: Never  ? Smokeless tobacco: Never  ?Substance Use Topics  ? Alcohol use: Yes  ?  Alcohol/week: 0.0 standard drinks  ?  Comment: occasional glass of wine  ? Drug use: No  ? ?Current Outpatient Medications  ?Medication Sig Dispense Refill  ? buPROPion (WELLBUTRIN SR) 150 MG 12 hr tablet Take 1 tablet (150 mg total) by mouth daily. 90 tablet 0  ?  Norethindrone-Ethinyl Estradiol-Fe Biphas (LO LOESTRIN FE) 1 MG-10 MCG / 10 MCG tablet Take 1 tablet by mouth daily.    ? tirzepatide (MOUNJARO) 10 MG/0.5ML Pen Inject 10 mg into the skin once a week. 6 mL 1  ? tirzepatide (MOUNJARO) 7.5 MG/0.5ML Pen Inject 7.5 mg into the skin once a week. 6 mL 1  ? ?No current facility-administered medications for this visit.  ? ?Allergies  ?Allergen Reactions  ? Atorvastatin Nausea Only  ? Penicillins Hives  ? ? ? ?Review of Systems: ?All systems reviewed and negative except where noted in HPI.  ? ? ? ?Physical Exam:   ? ?Wt Readings from Last 3 Encounters:  ?09/02/21 (!) 384 lb (174.2 kg)  ?08/06/21 (!) 395 lb (179.2 kg)  ?07/21/21 (!) 391 lb (177.4 kg)  ? ? ?There were no vitals taken for this visit. ?Constitutional:  Pleasant, in no acute distress. ?Psychiatric: Normal mood and affect. Behavior is normal. ?Cardiovascular: Normal rate, regular rhythm. No edema ?Pulmonary/chest: Effort normal and breath sounds normal. No wheezing, rales or rhonchi. ?Abdominal: Soft, nondistended, nontender.  ?Neurological: Alert and oriented to person place and time. ? ? ?ASSESSMENT AND PLAN;  ? ?1) Colon cancer screening ?-  Due for age-appropriate, average risk CRC screening ?- Schedule colonoscopy at [facility] ? ?2) Microcytic anemia ?- Chronic microcytic anemia.  Baseline Hgb has been largely 11-12 since 2016.  Previous anemia work-up in 2021 with low iron saturation, but otherwise normal iron indices, B12, folate.  Iron indices reduced further in 09/2020, with sat 7% and iron 23, and ferritin reduced 62 --> 25, all suggestive of IDA.  No repeat labs since then ?- Repeat CBC and iron panel now. If continued/worsening IDA, can add EGD to be done at time of colonoscopy along with starting oral iron therapy (which would need to be held 7 days prior to colonoscopy) ? ?3) Morbid obesity (BMI 67.5) ?4) OSA ?5) HTN ?- Procedures to be scheduled at [facility] due to elevated  periprocedural risks from underlying comorbidities ? ?6) Vitamin D deficiency ?- Recheck vitamin D for comalabsorption ? ?The indications, risks, and benefits of EGD and colonoscopy were explained to the patient in detail. Risks include but are not limited to bleeding, perforation, adverse reaction to medications, and cardiopulmonary compromise. Sequelae include but are not limited to the possibility of surgery, hospitalization, and mortality. The patient verbalized understanding and wished to proceed. All questions answered, referred to scheduler and bowel prep ordered. Further recommendations pending results of the exam.  ? ? ? ?Lavena Bullion, DO, FACG  09/30/2021, 3:49 PM ? ? ?Debbrah Alar, NP ? ?

## 2021-09-30 NOTE — Patient Instructions (Addendum)
If you are age 52 or older, your body mass index should be between 23-30. Your There is no height or weight on file to calculate BMI. If this is out of the aforementioned range listed, please consider follow up with your Primary Care Provider. ? ?If you are age 45 or younger, your body mass index should be between 19-25. Your There is no height or weight on file to calculate BMI. If this is out of the aformentioned range listed, please consider follow up with your Primary Care Provider.  ? ?________________________________________________________ ? ?The [place] [facility] providers would like to encourage you to use [facility] to communicate with providers for non-urgent requests or questions.  Due to long hold times on the telephone, sending your provider a message by [facility] may be a faster and more efficient way to get a response.  Please allow 48 business hours for a response.  Please remember that this is for non-urgent requests.  ?_______________________________________________________ ? ?Please go to the lab on the 2nd floor [address] Monday through Friday 8am to 4pm and close from lunch 12pm to 1pm. ? ?It has been recommended to you by your physician that you have a(n) EGD/Colon at [facility] completed. Per your request, we did not schedule the procedure(s) today. Please contact our office at [phone] should you decide to have the procedure completed. You will be scheduled for a pre-visit and procedure at that time. ? ?Please call with any questions or concerns. ? ?It was a pleasure to see you today! ? ?Gerrit Heck, D.O. ? ? ? ? ? ?We want to thank you for trusting [facility] with your care. All of our staff and providers value the relationships we have built with our patients, and it is an honor to care for you.  ? ?We are writing to let you know that [facility] will close on Nov 24, 2021, and we invite you to continue to see Dr. Carmell Austria and Gerrit Heck at the [facility] [place] office location. We are consolidating our serices at these [facility] practices to better provide care. Our office staff will work with you to ensure a seamless transition.  ? ?Gerrit Heck, DO -Dr. Bryan Lemma will be movig to [facility] at [address] [place], [place] [address], effective Nov 24, 2021.  Contact [phone] to schedule an appointment with him.  ? ?Carmell Austria, MD- Dr. Lyndel Safe will be movig to [facility] at [address] [place], [place] [address], effective Nov 24, 2021.  Contact [phone] to schedule an appointment with him.  ? ?Requesting Medical Records ?If you need to request your medical records, please follow the instructions below. Your medical records are confidential, and a copy can be transferred to another provider or released to you or another person you designate only with your permission. ? ?There are several ways to request your medical records: ?Requests for medical records can be submitted through our practice.   ?You can also request your records electronically, in your MyChart account by selecting the ?Request Health Records? tab.  ?If you need additional information on how to request records, please go to [email or web address] choose Patient Information, then select Request Medical Records. ?To make an appointment or if you have any questions about your health care needs, please contact our office at [phone] and one of our staff members will be glad to assist you. ? is committed to providing exceptional care for you and our  community. Thank you for allowing [place] to serve your health care needs. ?Sincerely, ? ?Windy Canny, Director [facility] ?[place] also offers convenient virtual care options. Sore throat? Sinus problems? Cold or flu symptoms? Get care from the comfort of home with [facility] Video Visits and e-Visits. Learn more about the non-emergency  conditions treated and start your virtual visit at [email or web address] ? ? ?

## 2021-10-02 ENCOUNTER — Ambulatory Visit (INDEPENDENT_AMBULATORY_CARE_PROVIDER_SITE_OTHER): Payer: 59 | Admitting: Family Medicine

## 2021-10-08 ENCOUNTER — Encounter: Payer: Self-pay | Admitting: Gastroenterology

## 2021-10-10 ENCOUNTER — Encounter: Payer: 59 | Admitting: Gastroenterology

## 2021-10-16 ENCOUNTER — Encounter (INDEPENDENT_AMBULATORY_CARE_PROVIDER_SITE_OTHER): Payer: Self-pay | Admitting: Family Medicine

## 2021-10-16 ENCOUNTER — Ambulatory Visit (INDEPENDENT_AMBULATORY_CARE_PROVIDER_SITE_OTHER): Payer: 59 | Admitting: Family Medicine

## 2021-10-16 VITALS — BP 124/79 | HR 79 | Temp 97.7°F | Ht 64.0 in | Wt 384.0 lb

## 2021-10-16 DIAGNOSIS — E66813 Obesity, class 3: Secondary | ICD-10-CM

## 2021-10-16 DIAGNOSIS — D500 Iron deficiency anemia secondary to blood loss (chronic): Secondary | ICD-10-CM

## 2021-10-16 DIAGNOSIS — I10 Essential (primary) hypertension: Secondary | ICD-10-CM | POA: Diagnosis not present

## 2021-10-16 DIAGNOSIS — E7849 Other hyperlipidemia: Secondary | ICD-10-CM

## 2021-10-16 DIAGNOSIS — R7301 Impaired fasting glucose: Secondary | ICD-10-CM | POA: Diagnosis not present

## 2021-10-16 DIAGNOSIS — E669 Obesity, unspecified: Secondary | ICD-10-CM

## 2021-10-16 DIAGNOSIS — Z72820 Sleep deprivation: Secondary | ICD-10-CM

## 2021-10-16 DIAGNOSIS — Z6841 Body Mass Index (BMI) 40.0 and over, adult: Secondary | ICD-10-CM

## 2021-10-16 DIAGNOSIS — E559 Vitamin D deficiency, unspecified: Secondary | ICD-10-CM | POA: Diagnosis not present

## 2021-10-16 DIAGNOSIS — R5383 Other fatigue: Secondary | ICD-10-CM

## 2021-10-16 MED ORDER — TIRZEPATIDE 10 MG/0.5ML ~~LOC~~ SOAJ: 10.0000 mg | SUBCUTANEOUS | 6 refills | Status: DC

## 2021-10-17 LAB — COMPREHENSIVE METABOLIC PANEL
ALT: 9 IU/L (ref 0–32)
AST: 10 IU/L (ref 0–40)
Albumin/Globulin Ratio: 1.5 (ref 1.2–2.2)
Albumin: 3.7 g/dL — ABNORMAL LOW (ref 3.8–4.9)
Alkaline Phosphatase: 90 IU/L (ref 44–121)
BUN/Creatinine Ratio: 10 (ref 9–23)
BUN: 8 mg/dL (ref 6–24)
Bilirubin Total: 0.4 mg/dL (ref 0.0–1.2)
CO2: 25 mmol/L (ref 20–29)
Calcium: 9.6 mg/dL (ref 8.7–10.2)
Chloride: 105 mmol/L (ref 96–106)
Creatinine, Ser: 0.8 mg/dL (ref 0.57–1.00)
Globulin, Total: 2.5 g/dL (ref 1.5–4.5)
Glucose: 84 mg/dL (ref 70–99)
Potassium: 4.7 mmol/L (ref 3.5–5.2)
Sodium: 142 mmol/L (ref 134–144)
Total Protein: 6.2 g/dL (ref 6.0–8.5)
eGFR: 89 mL/min/{1.73_m2} (ref >59)

## 2021-10-17 LAB — CBC WITH DIFFERENTIAL/PLATELET
Basophils Absolute: 0.1 10*3/uL (ref 0.0–0.2)
Basos: 1 %
EOS (ABSOLUTE): 0.2 10*3/uL (ref 0.0–0.4)
Eos: 2 %
Hemoglobin: 11.4 g/dL (ref 11.1–15.9)
Immature Grans (Abs): 0 10*3/uL (ref 0.0–0.1)
Immature Granulocytes: 0 %
Lymphocytes Absolute: 3.4 10*3/uL — ABNORMAL HIGH (ref 0.7–3.1)
Lymphs: 36 %
MCH: 22.5 pg — ABNORMAL LOW (ref 26.6–33.0)
MCHC: 29.8 g/dL — ABNORMAL LOW (ref 31.5–35.7)
MCV: 76 fL — ABNORMAL LOW (ref 79–97)
Monocytes Absolute: 0.5 10*3/uL (ref 0.1–0.9)
Monocytes: 5 %
Neutrophils Absolute: 5.3 10*3/uL (ref 1.4–7.0)
Neutrophils: 56 %
Platelets: 465 10*3/uL — ABNORMAL HIGH (ref 150–450)
RBC: 5.06 x10E6/uL (ref 3.77–5.28)
RDW: 16.4 % — ABNORMAL HIGH (ref 11.7–15.4)
WBC: 9.5 10*3/uL (ref 3.4–10.8)

## 2021-10-17 LAB — ANEMIA PANEL
Ferritin: 32 ng/mL (ref 15–150)
Folate, Hemolysate: 222 ng/mL
Folate, RBC: 580 ng/mL (ref >498)
Hematocrit: 38.3 % (ref 34.0–46.6)
Iron Saturation: 9 % — CL (ref 15–55)
Iron: 28 ug/dL (ref 27–159)
Retic Ct Pct: 1.2 % (ref 0.6–2.6)
Total Iron Binding Capacity: 301 ug/dL (ref 250–450)
UIBC: 273 ug/dL (ref 131–425)
Vitamin B-12: 325 pg/mL (ref 232–1245)

## 2021-10-17 LAB — LIPID PANEL WITH LDL/HDL RATIO
Cholesterol, Total: 173 mg/dL (ref 100–199)
HDL: 43 mg/dL (ref >39)
LDL Chol Calc (NIH): 116 mg/dL — ABNORMAL HIGH (ref 0–99)
LDL/HDL Ratio: 2.7 ratio (ref 0.0–3.2)
Triglycerides: 75 mg/dL (ref 0–149)
VLDL Cholesterol Cal: 14 mg/dL (ref 5–40)

## 2021-10-17 LAB — VITAMIN D 25 HYDROXY (VIT D DEFICIENCY, FRACTURES): Vit D, 25-Hydroxy: 19 ng/mL — ABNORMAL LOW (ref 30.0–100.0)

## 2021-10-17 LAB — HEMOGLOBIN A1C
Est. average glucose Bld gHb Est-mCnc: 111 mg/dL
Hgb A1c MFr Bld: 5.5 % (ref 4.8–5.6)

## 2021-10-17 LAB — TSH: TSH: 1.29 u[IU]/mL (ref 0.450–4.500)

## 2021-10-17 LAB — INSULIN, RANDOM: INSULIN: 11.3 u[IU]/mL (ref 2.6–24.9)

## 2021-10-21 NOTE — Progress Notes (Signed)
Chief Complaint:   OBESITY Angela Tucker is here to discuss her progress with her obesity treatment plan along with follow-up of her obesity related diagnoses. See Medical Weight Management Flowsheet for complete bioelectrical impedance results.  Today's visit was #: 22 Starting weight: 414 lbs Starting date: 09/19/2019 Weight change since last visit: 0 Total lbs lost to date: 30 lbs Total weight loss percentage to date: -7.25%  Nutrition Plan: Practicing portion control and making smarter food choices, such as increasing vegetables and decreasing simple carbohydrates for 80% of the time. Activity: None. Anti-obesity medications: Mounjaro 10 mg subcutaneously weekly. Reported side effects: None.  Interim History: Angela Tucker says she has not been drinking enough water.  She denies constipation.  She says she has not been exercising as much and is "still up and down" with her sleep.  Assessment/Plan:   1. Impaired fasting glucose, with polyphagia Improving, but not optimized. Current treatment: Mounjaro 10 mg subcutaneously weekly.    Plan: She will continue to focus on protein-rich, low simple carbohydrate foods. We reviewed the importance of hydration, regular exercise for stress reduction, and restorative sleep.  - Comprehensive metabolic panel - Hemoglobin A1c - Insulin, random - Refill tirzepatide (MOUNJARO) 10 MG/0.5ML Pen; Inject 10 mg into the skin once a week.  Dispense: 2 mL; Refill: 6  2. Poor sleep This is poorly controlled. Dysfunction: early morning awakening and non-restful sleep. Average hours of sleep per night: 6. Current treatment: None.   Plan: Recommend sleep hygiene measures including regular sleep schedule, optimal sleep environment, and relaxing presleep rituals.   3. Iron deficiency anemia due to chronic blood loss Nutrition: Iron-rich foods include dark leafy greens, red and white meats, eggs, seafood, and beans.  Certain foods and drinks prevent your body  from absorbing iron properly. Avoid eating these foods in the same meal as iron-rich foods or with iron supplements. These foods include: coffee, black tea, and red wine; milk, dairy products, and foods that are high in calcium; beans and soybeans; whole grains. Constipation can be a side effect of iron supplementation. Increased water and fiber intake are helpful. Water goal: > 2 liters/day. Fiber goal: > 25 grams/day.  - Anemia panel  4. Vitamin D deficiency Not at goal.   Plan: We will check her vitamin D level today.  Lab Results  Component Value Date   VD25OH 19.0 (L) 10/16/2021   VD25OH 22.3 (L) 09/24/2020   VD25OH 17.7 (L) 03/21/2020   - VITAMIN D 25 Hydroxy (Vit-D Deficiency, Fractures)  5. Essential hypertension At goal. Medications: None.   Plan: Avoid buying foods that are: processed, frozen, or prepackaged to avoid excess salt. We will watch for signs of hypotension as she continues lifestyle modifications.  BP Readings from Last 3 Encounters:  10/16/21 124/79  09/30/21 130/82  09/02/21 120/76   Lab Results  Component Value Date   CREATININE 0.80 10/16/2021   - CBC with Differential/Platelet - TSH  6. Other hyperlipidemia Course: Not at goal. Lipid-lowering medications: None.   Plan: Dietary changes: Increase soluble fiber, decrease simple carbohydrates, decrease saturated fat. Exercise changes: Moderate to vigorous-intensity aerobic activity 150 minutes per week or as tolerated. We will continue to monitor along with PCP/specialists as it pertains to her weight loss journey.  Lab Results  Component Value Date   CHOL 173 10/16/2021   HDL 43 10/16/2021   LDLCALC 116 (H) 10/16/2021   TRIG 75 10/16/2021   CHOLHDL 4.7 (H) 09/24/2020   Lab Results  Component Value  Date   ALT 9 10/16/2021   AST 10 10/16/2021   ALKPHOS 90 10/16/2021   BILITOT 0.4 10/16/2021   The 10-year ASCVD risk score (Arnett DK, et al., 2019) is: 2.3%   Values used to calculate the  score:     Age: 36 years     Sex: Female     Is Non-Hispanic African American: Yes     Diabetic: No     Tobacco smoker: No     Systolic Blood Pressure: 099 mmHg     Is BP treated: No     HDL Cholesterol: 43 mg/dL     Total Cholesterol: 173 mg/dL  - Lipid Panel With LDL/HDL Ratio  7. Other fatigue Will check labs today.  - VITAMIN D 25 Hydroxy (Vit-D Deficiency, Fractures) - TSH  8. Obesity, current BMI 66.1  Course: Angela Tucker is currently in the action stage of change. As such, her goal is to continue with weight loss efforts.   Nutrition goals: She has agreed to practicing portion control and making smarter food choices, such as increasing vegetables and decreasing simple carbohydrates.   Exercise goals:  As is.  Behavioral modification strategies: increasing lean protein intake, decreasing simple carbohydrates, increasing vegetables, and increasing water intake.  Zaya has agreed to follow-up with our clinic in 6 weeks. She was informed of the importance of frequent follow-up visits to maximize her success with intensive lifestyle modifications for her multiple health conditions.   Angela Tucker was informed we would discuss her lab results at her next visit unless there is a critical issue that needs to be addressed sooner. Angela Tucker agreed to keep her next visit at the agreed upon time to discuss these results.  Objective:   Blood pressure 124/79, pulse 79, temperature 97.7 F (36.5 C), height '5\' 4"'$  (1.626 m), weight (!) 384 lb (174.2 kg), SpO2 99 %. Body mass index is 65.91 kg/m.  General: Cooperative, alert, well developed, in no acute distress. HEENT: Conjunctivae and lids unremarkable. Cardiovascular: Regular rhythm.  Lungs: Normal work of breathing. Neurologic: No focal deficits.   Lab Results  Component Value Date   CREATININE 0.80 10/16/2021   BUN 8 10/16/2021   NA 142 10/16/2021   K 4.7 10/16/2021   CL 105 10/16/2021   CO2 25 10/16/2021   Lab Results   Component Value Date   ALT 9 10/16/2021   AST 10 10/16/2021   ALKPHOS 90 10/16/2021   BILITOT 0.4 10/16/2021   Lab Results  Component Value Date   HGBA1C 5.5 10/16/2021   HGBA1C 5.6 09/24/2020   HGBA1C 5.8 (H) 03/21/2020   HGBA1C 5.8 (H) 10/03/2019   HGBA1C 5.7 02/19/2016   Lab Results  Component Value Date   INSULIN 11.3 10/16/2021   INSULIN 15.3 09/24/2020   INSULIN 13.1 10/03/2019   Lab Results  Component Value Date   TSH 1.290 10/16/2021   Lab Results  Component Value Date   CHOL 173 10/16/2021   HDL 43 10/16/2021   LDLCALC 116 (H) 10/16/2021   TRIG 75 10/16/2021   CHOLHDL 4.7 (H) 09/24/2020   Lab Results  Component Value Date   VD25OH 19.0 (L) 10/16/2021   VD25OH 22.3 (L) 09/24/2020   VD25OH 17.7 (L) 03/21/2020   Lab Results  Component Value Date   WBC 9.5 10/16/2021   HGB 11.4 10/16/2021   HCT 38.3 10/16/2021   MCV 76 (L) 10/16/2021   PLT 465 (H) 10/16/2021   Lab Results  Component Value Date   IRON 28  10/16/2021   TIBC 301 10/16/2021   FERRITIN 32 10/16/2021   Attestation Statements:   Reviewed by clinician on day of visit: allergies, medications, problem list, medical history, surgical history, family history, social history, and previous encounter notes.  I, Water quality scientist, CMA, am acting as transcriptionist for Briscoe Deutscher, DO  I have reviewed the above documentation for accuracy and completeness, and I agree with the above. -  Briscoe Deutscher, DO, MS, FAAFP, DABOM - Family and Bariatric Medicine.

## 2021-10-22 ENCOUNTER — Other Ambulatory Visit (INDEPENDENT_AMBULATORY_CARE_PROVIDER_SITE_OTHER): Payer: Self-pay

## 2021-10-22 DIAGNOSIS — E559 Vitamin D deficiency, unspecified: Secondary | ICD-10-CM

## 2021-10-22 MED ORDER — VITAMIN D (ERGOCALCIFEROL) 1.25 MG (50000 UNIT) PO CAPS: 50000.0000 [IU] | ORAL_CAPSULE | ORAL | 0 refills | Status: DC

## 2021-10-28 ENCOUNTER — Telehealth: Payer: Self-pay | Admitting: *Deleted

## 2021-10-28 NOTE — Telephone Encounter (Signed)
Yes, please schedule for EGD and colonoscopy at [facility]. CBC was done somewhere else this month and still with microcytic anemia with increasing RDW, so presumably still with IDA. Please also have patient complete iron panel with ferritin. Thanks! ? ?

## 2021-10-28 NOTE — Telephone Encounter (Signed)
Called and spoke to pt and explained that according to our policy her procedure will have to be done at the hospital and someone from dr. Bryan Lemma office will be contacting her to schedule,made her aware that her pre-visit and upcoming procedure at [facility] will be cancelled,she verbalized understanding. Pt. BMI 67.46 ?

## 2021-11-06 ENCOUNTER — Other Ambulatory Visit: Payer: Self-pay

## 2021-11-06 ENCOUNTER — Telehealth: Payer: Self-pay

## 2021-11-06 DIAGNOSIS — D509 Iron deficiency anemia, unspecified: Secondary | ICD-10-CM

## 2021-11-06 DIAGNOSIS — Z1211 Encounter for screening for malignant neoplasm of colon: Secondary | ICD-10-CM

## 2021-11-06 NOTE — Telephone Encounter (Signed)
Left the patient VM that we were not able to schedule her egd and colonoscopy at [facility] on 12/24/21 as I spoke with her earlier. I will call her back once we have schedule out for July. Order are already placed. ?

## 2021-11-07 NOTE — Addendum Note (Signed)
Addended by: Berniece Salines A on: 11/07/2021 08:54 AM ? ? Modules accepted: Orders ? ?

## 2021-11-07 NOTE — Telephone Encounter (Signed)
Spoke with pt and pt is scheduled for July 6th at 10:45 am at [facility] with Dr. Bryan Lemma. In person previsit scheduled for 12/18/21 at 4:30 pm. Let pt know that Dr. Bryan Lemma wanted her to complete iron panel with ferritin. Pt verbalized understanding and stated she would be able to go to lab in [facility] next week.  ?

## 2021-12-18 ENCOUNTER — Ambulatory Visit (AMBULATORY_SURGERY_CENTER): Payer: 59 | Admitting: *Deleted

## 2021-12-18 VITALS — Ht 64.0 in | Wt 375.0 lb

## 2021-12-18 DIAGNOSIS — Z1211 Encounter for screening for malignant neoplasm of colon: Secondary | ICD-10-CM

## 2021-12-18 DIAGNOSIS — D509 Iron deficiency anemia, unspecified: Secondary | ICD-10-CM

## 2021-12-18 MED ORDER — NA SULFATE-K SULFATE-MG SULF 17.5-3.13-1.6 GM/177ML PO SOLN: 1.0000 | Freq: Once | ORAL | 0 refills | Status: AC

## 2021-12-18 NOTE — Progress Notes (Signed)
Pt's previsit is done over the phone and all paperwork (prep instructions, blank consent form to just read over) sent to patient.  Pt's name and DOB verified at the beginning of the previsit.  Pt denies any difficulty with ambulating.     No egg or soy allergy  No home oxygen use   No medications for weight loss taken  emmi information given  Pt denies constipation issues  Pt informed that we do not do prior authorizations for prep

## 2021-12-26 ENCOUNTER — Encounter: Payer: 59 | Admitting: Gastroenterology

## 2022-01-06 ENCOUNTER — Encounter (HOSPITAL_COMMUNITY): Payer: Self-pay | Admitting: Gastroenterology

## 2022-01-07 ENCOUNTER — Encounter: Payer: Self-pay | Admitting: Gastroenterology

## 2022-01-15 ENCOUNTER — Ambulatory Visit (HOSPITAL_COMMUNITY): Payer: 59 | Admitting: Anesthesiology

## 2022-01-15 ENCOUNTER — Ambulatory Visit (HOSPITAL_COMMUNITY)
Admission: RE | Admit: 2022-01-15 | Discharge: 2022-01-15 | Disposition: A | Discharge status: Home / Self Care | Payer: 59 | Source: Ambulatory Visit | Attending: Gastroenterology | Admitting: Gastroenterology

## 2022-01-15 ENCOUNTER — Ambulatory Visit (HOSPITAL_BASED_OUTPATIENT_CLINIC_OR_DEPARTMENT_OTHER): Payer: 59 | Admitting: Anesthesiology

## 2022-01-15 ENCOUNTER — Encounter (HOSPITAL_COMMUNITY): Payer: Self-pay | Admitting: Gastroenterology

## 2022-01-15 ENCOUNTER — Other Ambulatory Visit: Payer: Self-pay

## 2022-01-15 ENCOUNTER — Encounter (HOSPITAL_COMMUNITY)
Admission: RE | Disposition: A | Discharge status: Home / Self Care | Payer: Self-pay | Source: Ambulatory Visit | Attending: Gastroenterology

## 2022-01-15 DIAGNOSIS — K635 Polyp of colon: Secondary | ICD-10-CM

## 2022-01-15 DIAGNOSIS — G4733 Obstructive sleep apnea (adult) (pediatric): Secondary | ICD-10-CM | POA: Insufficient documentation

## 2022-01-15 DIAGNOSIS — Z1211 Encounter for screening for malignant neoplasm of colon: Secondary | ICD-10-CM | POA: Insufficient documentation

## 2022-01-15 DIAGNOSIS — E559 Vitamin D deficiency, unspecified: Secondary | ICD-10-CM | POA: Insufficient documentation

## 2022-01-15 DIAGNOSIS — K5730 Diverticulosis of large intestine without perforation or abscess without bleeding: Secondary | ICD-10-CM

## 2022-01-15 DIAGNOSIS — K640 First degree hemorrhoids: Secondary | ICD-10-CM

## 2022-01-15 DIAGNOSIS — Q8589 Other phakomatoses, not elsewhere classified: Secondary | ICD-10-CM | POA: Insufficient documentation

## 2022-01-15 DIAGNOSIS — D509 Iron deficiency anemia, unspecified: Secondary | ICD-10-CM

## 2022-01-15 DIAGNOSIS — K648 Other hemorrhoids: Secondary | ICD-10-CM | POA: Diagnosis not present

## 2022-01-15 DIAGNOSIS — K2950 Unspecified chronic gastritis without bleeding: Secondary | ICD-10-CM | POA: Insufficient documentation

## 2022-01-15 HISTORY — PX: POLYPECTOMY: SHX5525

## 2022-01-15 HISTORY — PX: COLONOSCOPY WITH PROPOFOL: SHX5780

## 2022-01-15 HISTORY — PX: BIOPSY: SHX5522

## 2022-01-15 HISTORY — PX: ESOPHAGOGASTRODUODENOSCOPY (EGD) WITH PROPOFOL: SHX5813

## 2022-01-15 SURGERY — COLONOSCOPY WITH PROPOFOL
Anesthesia: Monitor Anesthesia Care

## 2022-01-15 MED ORDER — LACTATED RINGERS IV SOLN: INTRAVENOUS | Status: DC

## 2022-01-15 MED ORDER — PROPOFOL 10 MG/ML IV BOLUS
INTRAVENOUS | Status: DC | PRN
  Administered 2022-01-15: 10 mg via INTRAVENOUS
  Administered 2022-01-15 (×2): 20 mg via INTRAVENOUS
  Administered 2022-01-15: 10 mg via INTRAVENOUS
  Administered 2022-01-15 (×2): 20 mg via INTRAVENOUS

## 2022-01-15 MED ORDER — PROPOFOL 500 MG/50ML IV EMUL
INTRAVENOUS | Status: DC | PRN
  Administered 2022-01-15: 100 ug/kg/min via INTRAVENOUS

## 2022-01-15 MED ORDER — SODIUM CHLORIDE 0.9 % IV SOLN: INTRAVENOUS | Status: DC

## 2022-01-15 MED ORDER — PROPOFOL 1000 MG/100ML IV EMUL
INTRAVENOUS | Status: AC
  Filled 2022-01-15: qty 100

## 2022-01-15 MED ORDER — LIDOCAINE HCL (CARDIAC) PF 100 MG/5ML IV SOSY
PREFILLED_SYRINGE | INTRAVENOUS | Status: DC | PRN
  Administered 2022-01-15: 100 mg via INTRAVENOUS

## 2022-01-15 SURGICAL SUPPLY — 25 items

## 2022-01-15 NOTE — Transfer of Care (Signed)
Immediate Anesthesia Transfer of Care Note  Patient: Angela Tucker  Procedure(s) Performed: COLONOSCOPY WITH PROPOFOL ESOPHAGOGASTRODUODENOSCOPY (EGD) WITH PROPOFOL BIOPSY  Patient Location: PACU  Anesthesia Type:MAC  Level of Consciousness: awake, alert , oriented and patient cooperative  Airway & Oxygen Therapy: Patient Spontanous Breathing and Patient connected to face mask oxygen  Post-op Assessment: Report given to RN, Post -op Vital signs reviewed and stable and Patient moving all extremities X 4  Post vital signs: Reviewed and stable  Last Vitals:  Vitals Value Taken Time  BP 104/64 01/15/22 1122  Temp 35.7 C 01/15/22 1122  Pulse 95 01/15/22 1124  Resp 15 01/15/22 1124  SpO2 94 % 01/15/22 1124  Vitals shown include unvalidated device data.  Last Pain:  Vitals:   01/15/22 1122  TempSrc: Tympanic  PainSc: Asleep         Complications: No notable events documented.

## 2022-01-15 NOTE — Interval H&P Note (Signed)
History and Physical Interval Note:  01/15/2022 10:37 AM  Angela Tucker  has presented today for surgery, with the diagnosis of Colon cancer Screening, iron deficiency anemia.  The various methods of treatment have been discussed with the patient and family. After consideration of risks, benefits and other options for treatment, the patient has consented to  Procedure(s): COLONOSCOPY WITH PROPOFOL (N/A) ESOPHAGOGASTRODUODENOSCOPY (EGD) WITH PROPOFOL (N/A) as a surgical intervention.  The patient's history has been reviewed, patient examined, no change in status, stable for surgery.  I have reviewed the patient's chart and labs.  Questions were answered to the patient's satisfaction.     [name] Estefanny Moler

## 2022-01-15 NOTE — Discharge Instructions (Signed)
YOU HAD AN ENDOSCOPIC PROCEDURE TODAY: Refer to the procedure report and other information in the discharge instructions given to you for any specific questions about what was found during the examination. If this information does not answer your questions, please call [place] office at [phone] to clarify.  ° °YOU SHOULD EXPECT: Some feelings of bloating in the abdomen. Passage of more gas than usual. Walking can help get rid of the air that was put into your GI tract during the procedure and reduce the bloating. If you had a lower endoscopy (such as a colonoscopy or flexible sigmoidoscopy) you may notice spotting of blood in your stool or on the toilet paper. Some abdominal soreness may be present for a day or two, also. ° °DIET: Your first meal following the procedure should be a light meal and then it is ok to progress to your normal diet. A half-sandwich or bowl of soup is an example of a good first meal. Heavy or fried foods are harder to digest and may make you feel nauseous or bloated. Drink plenty of fluids but you should avoid alcoholic beverages for 24 hours. If you had a esophageal dilation, please see attached instructions for diet.   ° °ACTIVITY: Your care partner should take you home directly after the procedure. You should plan to take it easy, moving slowly for the rest of the day. You can resume normal activity the day after the procedure however YOU SHOULD NOT DRIVE, use power tools, machinery or perform tasks that involve climbing or major physical exertion for 24 hours (because of the sedation medicines used during the test).  ° °SYMPTOMS TO REPORT IMMEDIATELY: °A gastroenterologist can be reached at any hour. Please call [phone]  for any of the following symptoms:  °Following lower endoscopy (colonoscopy, flexible sigmoidoscopy) °Excessive amounts of blood in the stool  °Significant tenderness, worsening of abdominal pains  °Swelling of the abdomen that is new, acute  °Fever of 100° or  higher  °Following upper endoscopy (EGD, EUS, ERCP, esophageal dilation) °Vomiting of blood or coffee ground material  °New, significant abdominal pain  °New, significant chest pain or pain under the shoulder blades  °Painful or persistently difficult swallowing  °New shortness of breath  °Black, tarry-looking or red, bloody stools ° °FOLLOW UP:  °If any biopsies were taken you will be contacted by phone or by letter within the next 1-3 weeks. Call [phone]  if you have not heard about the biopsies in 3 weeks.  °Please also call with any specific questions about appointments or follow up tests. ° °

## 2022-01-15 NOTE — Op Note (Signed)
[facility] Patient Name: Angela Tucker Procedure Date: 01/15/2022 MRN: [id number] Attending MD: Gerrit Heck , MD Date of Birth: 1970-09-09 CSN: [id number] Age: 51 Admit Type: Outpatient Procedure:                Colonoscopy with snare polypectomy Indications:              Screening for colorectal malignant neoplasm. This                            is the patient's first colonoscopy                           Additionally, recent labs notable for iron                            deficiency anemia. No overt GI bleeding. Providers:                Gerrit Heck, MD, Ladoris Gene, RN, Luan Moore, Technician Referring MD:              Medicines:                Monitored Anesthesia Care Complications:            No immediate complications. Estimated Blood Loss:     Estimated blood loss was minimal. Procedure:                Pre-Anesthesia Assessment:                           - Prior to the procedure, a History and Physical                            was performed, and patient medications and                            allergies were reviewed. The patient's tolerance of                            previous anesthesia was also reviewed. The risks                            and benefits of the procedure and the sedation                            options and risks were discussed with the patient.                            All questions were answered, and informed consent                            was obtained. Prior Anticoagulants: The patient has                            taken  no previous anticoagulant or antiplatelet                            agents. ASA Grade Assessment: III - A patient with                            severe systemic disease. After reviewing the risks                            and benefits, the patient was deemed in                            satisfactory condition to undergo the procedure.                            After obtaining informed consent, the colonoscope                            was passed under direct vision. Throughout the                            procedure, the patient's blood pressure, pulse, and                            oxygen saturations were monitored continuously. The                            CF-HQ190L ([id number]) Olympus colonoscope was                            introduced through the anus and advanced to the the                            cecum, identified by appendiceal orifice and                            ileocecal valve. The colonoscopy was performed                            without difficulty. The patient tolerated the                            procedure well. The quality of the bowel                            preparation was good. The ileocecal valve,                            appendiceal orifice, and rectum were photographed. Scope In: 10:59:35 AM Scope Out: 11:15:39 AM Scope Withdrawal Time: 0 hours 9 minutes 47 seconds  Total Procedure Duration: 0 hours 16 minutes 4 seconds  Findings:      The perianal and digital rectal examinations were normal.      A 3 mm polyp was found in the sigmoid colon.  The polyp was sessile. The       polyp was removed with a cold snare. Resection and retrieval were       complete. Estimated blood loss was minimal.      A few small-mouthed diverticula were found in the sigmoid colon,       descending colon and ascending colon.      Non-bleeding internal hemorrhoids were found during retroflexion. The       hemorrhoids were small. Impression:               - One 3 mm polyp in the sigmoid colon, removed with                            a cold snare. Resected and retrieved.                           - Diverticulosis in the sigmoid colon, in the                            descending colon and in the ascending colon.                           - Non-bleeding internal hemorrhoids. Moderate Sedation:      Not Applicable - Patient  had care per Anesthesia. Recommendation:           - Patient has a contact number available for                            emergencies. The signs and symptoms of potential                            delayed complications were discussed with the                            patient. Return to normal activities tomorrow.                            Written discharge instructions were provided to the                            patient.                           - Resume previous diet.                           - Continue present medications.                           - Await pathology results.                           - Repeat colonoscopy in 5-10 years for surveillance                            based on pathology results.                           -  If pathology from upper endoscopy is otherwise                            unrevealing and ongoing iron deficiency anemia, can                            consider Video Capsule Endoscopy (VCE) for further                            small bowel interrogation.                           - Return to GI clinic PRN. Procedure Code(s):        --- Professional ---                           [phone], Colonoscopy, flexible; with removal of                            tumor(s), polyp(s), or other lesion(s) by snare                            technique Diagnosis Code(s):        --- Professional ---                           Z12.11, Encounter for screening for malignant                            neoplasm of colon                           K63.5, Polyp of colon                           K64.8, Other hemorrhoids                           K57.30, Diverticulosis of large intestine without                            perforation or abscess without bleeding CPT copyright 2019 American Medical Association. All rights reserved. The codes documented in this report are preliminary and upon coder review may  be revised to meet current compliance requirements. Gerrit Heck, MD 01/15/2022 11:27:30 AM Number of Addenda: 0

## 2022-01-15 NOTE — Anesthesia Postprocedure Evaluation (Signed)
Anesthesia Post Note  Patient: Financial trader  Procedure(s) Performed: COLONOSCOPY WITH PROPOFOL ESOPHAGOGASTRODUODENOSCOPY (EGD) WITH PROPOFOL BIOPSY     Patient location during evaluation: Endoscopy Anesthesia Type: MAC Level of consciousness: awake and alert Pain management: pain level controlled Vital Signs Assessment: post-procedure vital signs reviewed and stable Respiratory status: spontaneous breathing, nonlabored ventilation, respiratory function stable and patient connected to nasal cannula oxygen Cardiovascular status: stable and blood pressure returned to baseline Postop Assessment: no apparent nausea or vomiting Anesthetic complications: no   No notable events documented.  Last Vitals:  Vitals:   01/15/22 1132 01/15/22 1142  BP: 126/78 136/72  Pulse: 82 76  Resp: (!) 22 15  Temp:    SpO2: 97% 98%    Last Pain:  Vitals:   01/15/22 1142  TempSrc:   PainSc: 0-No pain                 Belenda Cruise P Ariel Wingrove

## 2022-01-15 NOTE — Anesthesia Preprocedure Evaluation (Addendum)
Anesthesia Evaluation  Patient identified by MRN, date of birth, ID band Patient awake    Reviewed: Allergy & Precautions, NPO status , Patient's Chart, lab work & pertinent test results  Airway Mallampati: III  TM Distance: >3 FB Neck ROM: Full    Dental no notable dental hx.    Pulmonary sleep apnea ,    Pulmonary exam normal        Cardiovascular hypertension,  Rhythm:Regular Rate:Normal     Neuro/Psych negative neurological ROS  negative psych ROS   GI/Hepatic Neg liver ROS, screening   Endo/Other  Morbid obesity  Renal/GU negative Renal ROS  negative genitourinary   Musculoskeletal  (+) Arthritis , Osteoarthritis,    Abdominal Normal abdominal exam  (+)   Peds  Hematology  (+) Blood dyscrasia, anemia ,   Anesthesia Other Findings   Reproductive/Obstetrics                             Anesthesia Physical Anesthesia Plan  ASA: 3  Anesthesia Plan: MAC   Post-op Pain Management:    Induction: Intravenous  PONV Risk Score and Plan: 2 and Propofol infusion and Treatment may vary due to age or medical condition  Airway Management Planned: Simple Face Mask, Natural Airway and Nasal Cannula  Additional Equipment: None  Intra-op Plan:   Post-operative Plan:   Informed Consent: I have reviewed the patients History and Physical, chart, labs and discussed the procedure including the risks, benefits and alternatives for the proposed anesthesia with the patient or authorized representative who has indicated his/her understanding and acceptance.     Dental advisory given  Plan Discussed with: CRNA  Anesthesia Plan Comments:         Anesthesia Quick Evaluation

## 2022-01-15 NOTE — H&P (Signed)
GASTROENTEROLOGY PROCEDURE H&P NOTE   Primary Care Physician: Debbrah Alar, NP    Reason for Procedure:  Iron deficiency anemia, vitamin D deficiency, colon cancer screening  Plan:    EGD, colonoscopy  Patient is appropriate for endoscopic procedure(s) at [facility].  The nature of the procedure, as well as the risks, benefits, and alternatives were carefully and thoroughly reviewed with the patient. Ample time for discussion and questions allowed. The patient understood, was satisfied, and agreed to proceed.     HPI: Angela Tucker is a 51 y.o. female who presents for EGD and colonoscopy for evaluation of iron deficiency anemia and colon cancer screening.  No previous EGD or colonoscopy.  Due to elevated periprocedural risks from underlying comorbidities (OSA, BMI 64), procedures scheduled at [facility] Endoscopy unit.  Past Medical History:  Diagnosis Date   Anemia    Arthritis    Gout    HLD (hyperlipidemia)    Hypertension    Lower extremity edema    OSA (obstructive sleep apnea)    Sleep apnea     Past Surgical History:  Procedure Laterality Date   CESAREAN SECTION     x 3   TUBAL LIGATION      Prior to Admission medications   Medication Sig Start Date End Date Taking? Authorizing Provider  Norethindrone Acetate-Ethinyl Estrad-FE (JUNEL FE 24) 1-20 MG-MCG(24) tablet Take 1 tablet by mouth daily.   Yes [provider]  tirzepatide Darcel Bayley) 10 MG/0.5ML Pen Inject 10 mg into the skin once a week. Patient taking differently: Inject 12.5 mg into the skin once a week. 10/16/21  Yes Briscoe Deutscher, DO  Vitamin D, Ergocalciferol, (DRISDOL) 1.25 MG (50000 UNIT) CAPS capsule Take 1 capsule (50,000 Units total) by mouth every 7 (seven) days. 10/22/21  Yes Briscoe Deutscher, DO    Current Facility-Administered Medications  Medication Dose Route Frequency Provider Last Rate Last Admin   0.9 %  sodium chloride infusion    Intravenous Continuous Sapna Padron V, DO       lactated ringers infusion   Intravenous Continuous Raliyah Montella V, DO 10 mL/hr at 01/15/22 1022 New Bag at 01/15/22 1022    Allergies as of 11/06/2021 - Review Complete 10/16/2021  Allergen Reaction Noted   Atorvastatin Nausea Only 02/19/2016   Penicillins Hives 08/28/2014    Family History  Problem Relation Age of Onset   Cancer Mother 72       breast   Hypertension Father    Diabetes Father    Diabetes Paternal Grandmother    Hypertension Paternal Grandmother    Diabetes Paternal Grandfather    Hypertension Paternal Grandfather    Colon cancer Neg Hx    Rectal cancer Neg Hx    Stomach cancer Neg Hx    Esophageal cancer Neg Hx     Social History   Socioeconomic History   Marital status: Married    Spouse name: [name]   Number of children: 3   Years of education: Not on file   Highest education level: Not on file  Occupational History   Not on file  Tobacco Use   Smoking status: Never   Smokeless tobacco: Never  Vaping Use   Vaping Use: Never used  Substance and Sexual Activity   Alcohol use: Yes    Alcohol/week: 0.0 standard drinks of alcohol    Comment: occasional glass of wine   Drug use: No   Sexual activity: Yes  Other Topics Concern  Not on file  Social History Narrative   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, learning disability   Married   3 children   Born 1997- Son [name]   2002- [name] daughter   2004- [name] son   Masters in [employer]   Social Determinants of Radio broadcast assistant Strain: Not on [employer] Insecurity: Not on file  Transportation Needs: Not on file  Physical Activity: Not on file  Stress: Not on file  Social Connections: Not on file  Intimate Partner Violence: Not on file    Physical Exam: Vital signs in last 24 hours: '@BP'$  (!) 165/86   Pulse 92   Temp (!) 96.2 F (35.7 C) (Temporal)   Resp 17   Ht '5\' 4"'$  (1.626 m)   Wt (!) 170.1 kg   SpO2 97%    BMI 64.37 kg/m  GEN: NAD EYE: Sclerae anicteric ENT: MMM CV: Non-tachycardic Pulm: CTA b/l GI: Soft, NT/ND NEURO:  Alert & Oriented x 3   Gerrit Heck, DO [facility]   01/15/2022 10:29 AM

## 2022-01-15 NOTE — Op Note (Signed)
[facility] Patient Name: Angela Tucker Procedure Date: 01/15/2022 MRN: [id number] Attending MD: Gerrit Heck , MD Date of Birth: 06-05-1971 CSN: [id number] Age: 51 Admit Type: Outpatient Procedure:                Upper GI endoscopy Indications:              Iron deficiency anemia Providers:                Gerrit Heck, MD, Ladoris Gene, RN, Luan Moore, Technician Referring MD:              Medicines:                Monitored Anesthesia Care Complications:            No immediate complications. Estimated Blood Loss:     Estimated blood loss was minimal. Procedure:                Pre-Anesthesia Assessment:                           - Prior to the procedure, a History and Physical                            was performed, and patient medications and                            allergies were reviewed. The patient's tolerance of                            previous anesthesia was also reviewed. The risks                            and benefits of the procedure and the sedation                            options and risks were discussed with the patient.                            All questions were answered, and informed consent                            was obtained. Prior Anticoagulants: The patient has                            taken no previous anticoagulant or antiplatelet                            agents. ASA Grade Assessment: III - A patient with                            severe systemic disease. After reviewing the risks  and benefits, the patient was deemed in                            satisfactory condition to undergo the procedure.                           After obtaining informed consent, the endoscope was                            passed under direct vision. Throughout the                            procedure, the patient's blood pressure, pulse, and                            oxygen  saturations were monitored continuously. The                            [id number]-[id number] ([id number]) Olympus endoscope was introduced                            through the mouth, and advanced to the third part                            of duodenum. The upper GI endoscopy was                            accomplished without difficulty. The patient                            tolerated the procedure well. Scope In: Scope Out: Findings:      The examined esophagus was normal.      The entire examined stomach was normal. Biopsies were taken with a cold       forceps for Helicobacter pylori testing. Estimated blood loss was       minimal.      The examined duodenum was normal. Biopsies for histology were taken with       a cold forceps for evaluation of celiac disease. Estimated blood loss       was minimal. Impression:               - Normal esophagus.                           - Normal stomach. Biopsied.                           - Normal examined duodenum. Biopsied. Moderate Sedation:      Not Applicable - Patient had care per Anesthesia. Recommendation:           - Patient has a contact number available for                            emergencies. The signs and symptoms of potential  delayed complications were discussed with the                            patient. Return to normal activities tomorrow.                            Written discharge instructions were provided to the                            patient.                           - Resume previous diet.                           - Continue present medications.                           - Await pathology results.                           - Perform a colonoscopy today. Procedure Code(s):        --- Professional ---                           [phone], Esophagogastroduodenoscopy, flexible,                            transoral; with biopsy, single or multiple Diagnosis Code(s):        --- Professional ---                            D50.9, Iron deficiency anemia, unspecified CPT copyright 2019 American Medical Association. All rights reserved. The codes documented in this report are preliminary and upon coder review may  be revised to meet current compliance requirements. Gerrit Heck, MD 01/15/2022 11:23:17 AM Number of Addenda: 0

## 2022-01-16 LAB — SURGICAL PATHOLOGY

## 2022-01-22 ENCOUNTER — Encounter: Payer: Self-pay | Admitting: Gastroenterology

## 2022-02-03 ENCOUNTER — Ambulatory Visit: Payer: 59 | Admitting: Family

## 2022-02-03 NOTE — Progress Notes (Shared)
Subjective:   By signing my name below, I, Carylon Perches, attest that this documentation has been prepared under the direction and in the presence of [place], NP 02/03/2022   Patient ID: Angela Tucker, female    DOB: 1970-11-05, 51 y.o.   MRN: [id number]  No chief complaint on file.   HPI Patient is in today for an office visit  Health Maintenance Due  Topic Date Due   PAP SMEAR-Modifier  10/09/2018   MAMMOGRAM  03/13/2020   COVID-19 Vaccine (3 - Pfizer risk series) 04/10/2020   Zoster Vaccines- Shingrix (2 of 2) 10/01/2021    Past Medical History:  Diagnosis Date   Anemia    Arthritis    Gout    HLD (hyperlipidemia)    Hypertension    Lower extremity edema    OSA (obstructive sleep apnea)    Sleep apnea     Past Surgical History:  Procedure Laterality Date   BIOPSY  01/15/2022   Procedure: BIOPSY;  Surgeon: Lavena Bullion, DO;  Location: [facility];  Service: Gastroenterology;;   CESAREAN SECTION     x 3   COLONOSCOPY WITH PROPOFOL N/A 01/15/2022   Procedure: COLONOSCOPY WITH PROPOFOL;  Surgeon: Lavena Bullion, DO;  Location: [facility];  Service: Gastroenterology;  Laterality: N/A;   ESOPHAGOGASTRODUODENOSCOPY (EGD) WITH PROPOFOL N/A 01/15/2022   Procedure: ESOPHAGOGASTRODUODENOSCOPY (EGD) WITH PROPOFOL;  Surgeon: Lavena Bullion, DO;  Location: [facility];  Service: Gastroenterology;  Laterality: N/A;   POLYPECTOMY  01/15/2022   Procedure: POLYPECTOMY;  Surgeon: Lavena Bullion, DO;  Location: [facility];  Service: Gastroenterology;;   TUBAL LIGATION      Family History  Problem Relation Age of Onset   Cancer Mother 59       breast   Hypertension Father    Diabetes Father    Diabetes Paternal Grandmother    Hypertension Paternal Grandmother    Diabetes Paternal Grandfather    Hypertension Paternal Grandfather    Colon cancer Neg Hx    Rectal cancer Neg Hx    Stomach cancer Neg Hx    Esophageal cancer Neg Hx     Social  History   Socioeconomic History   Marital status: Married    Spouse name: Valeda Corzine   Number of children: 3   Years of education: Not on file   Highest education level: Not on file  Occupational History   Not on file  Tobacco Use   Smoking status: Never   Smokeless tobacco: Never  Vaping Use   Vaping Use: Never used  Substance and Sexual Activity   Alcohol use: Yes    Alcohol/week: 0.0 standard drinks of alcohol    Comment: occasional glass of wine   Drug use: No   Sexual activity: Yes  Other Topics Concern   Not on file  Social History Narrative   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     tirzepatide (MOUNJARO) 10 MG/0.5ML Pen Inject 10 mg into the skin once a week. (Patient taking differently: Inject 12.5 mg into the skin once a week.) 2 mL 6   Vitamin D, Ergocalciferol, (DRISDOL) 1.25 MG (50000 UNIT) CAPS capsule Take 1 capsule (50,000 Units total) by mouth every 7 (seven) days. 12 capsule 0   No facility-administered medications prior to visit.    Allergies  Allergen Reactions   Atorvastatin Nausea Only   Penicillins Hives    ROS     Objective:    Physical Exam Constitutional:      General: She is not in acute distress.    Appearance: Normal appearance. She is not ill-appearing.  HENT:     Head: Normocephalic and atraumatic.     Right Ear: External ear normal.     Left Ear:  External ear normal.  Eyes:     Extraocular Movements: Extraocular movements intact.     Pupils: Pupils are equal, round, and reactive to light.  Cardiovascular:     Rate and Rhythm: Normal rate and regular rhythm.     Heart sounds: Normal heart sounds. No murmur heard.    No gallop.  Pulmonary:     Effort: Pulmonary effort is normal. No respiratory distress.     Breath sounds: No wheezing or rales.  Skin:    General: Skin is warm and dry.  Neurological:     Mental Status: She is alert and oriented to person, place, and time.  Psychiatric:        Mood and Affect: Mood normal.        Behavior: Behavior normal.        Judgment: Judgment normal.     There were no vitals taken for this visit. Wt Readings from Last 3 Encounters:  01/15/22 (!) 375 lb (170.1 kg)  12/18/21 (!) 375 lb (170.1 kg)  10/16/21 (!) 384 lb (174.2 kg)       Assessment & Plan:   Problem List Items Addressed This Visit   None    No orders of the defined types were placed in this encounter.   I, Carylon Perches, personally preformed the services described in this documentation.  All medical record entries made by the scribe were at my direction and in my presence.  I have reviewed the chart and discharge instructions (if applicable) and agree that the record reflects my personal performance and is accurate and complete. 02/03/2022   I,Amber Collins,acting as a scribe for Nance Pear, NP.,have documented all relevant documentation on the behalf of Nance Pear, NP,as directed by  Nance Pear, NP while in the presence of Nance Pear, NP.    [employer]

## 2022-02-18 ENCOUNTER — Encounter (INDEPENDENT_AMBULATORY_CARE_PROVIDER_SITE_OTHER): Payer: Self-pay

## 2022-07-01 ENCOUNTER — Encounter (HOSPITAL_BASED_OUTPATIENT_CLINIC_OR_DEPARTMENT_OTHER): Payer: Self-pay

## 2022-07-01 ENCOUNTER — Emergency Department (HOSPITAL_BASED_OUTPATIENT_CLINIC_OR_DEPARTMENT_OTHER)
Admission: EM | Admit: 2022-07-01 | Discharge: 2022-07-01 | Disposition: A | Discharge status: Home / Self Care | Payer: 59 | Attending: Emergency Medicine | Admitting: Emergency Medicine

## 2022-07-01 DIAGNOSIS — J069 Acute upper respiratory infection, unspecified: Secondary | ICD-10-CM | POA: Insufficient documentation

## 2022-07-01 DIAGNOSIS — Z20822 Contact with and (suspected) exposure to covid-19: Secondary | ICD-10-CM | POA: Diagnosis not present

## 2022-07-01 DIAGNOSIS — R059 Cough, unspecified: Secondary | ICD-10-CM | POA: Diagnosis present

## 2022-07-01 LAB — RESP PANEL BY RT-PCR (RSV, FLU A&B, COVID)  RVPGX2
Influenza A by PCR: NEGATIVE
Influenza B by PCR: NEGATIVE
Resp Syncytial Virus by PCR: NEGATIVE
SARS Coronavirus 2 by RT PCR: NEGATIVE

## 2022-07-01 NOTE — ED Triage Notes (Signed)
C/o cough, bodyaches, sore throat x few days

## 2022-07-01 NOTE — Discharge Instructions (Signed)
Your husband's test for COVID was positive.  Ears were negative but I strongly suspect this is a false negative result.  I suspect you do have COVID.  You should quarantine for 10 days from the start of your symptoms.

## 2022-07-01 NOTE — ED Provider Notes (Signed)
[facility] EMERGENCY DEPARTMENT Provider Note   CSN: [id number] Arrival date & time: 07/01/22  1039     History  Chief Complaint  Patient presents with   URI    Angela Tucker is a 51 y.o. female presenting to ED with cough, congestion, body aches, headache for 2 days.  Her husband has similar symptoms and is also here in the ED as the patient  HPI     Home Medications Prior to Admission medications   Medication Sig Start Date End Date Taking? Authorizing Provider  Norethindrone Acetate-Ethinyl Estrad-FE (JUNEL FE 24) 1-20 MG-MCG(24) tablet Take 1 tablet by mouth daily.    [provider]  tirzepatide Darcel Bayley) 10 MG/0.5ML Pen Inject 10 mg into the skin once a week. Patient taking differently: Inject 12.5 mg into the skin once a week. 10/16/21   Briscoe Deutscher, DO  Vitamin D, Ergocalciferol, (DRISDOL) 1.25 MG (50000 UNIT) CAPS capsule Take 1 capsule (50,000 Units total) by mouth every 7 (seven) days. 10/22/21   Briscoe Deutscher, DO      Allergies    Atorvastatin and Penicillins    Review of Systems   Review of Systems  Physical Exam Updated Vital Signs BP 129/72 (BP Location: Left Wrist)   Pulse 87   Temp 98 F (36.7 C) (Oral)   Resp (!) 22   Ht '5\' 4"'$  (1.626 m)   Wt (!) 170.1 kg   SpO2 95%   BMI 64.37 kg/m  Physical Exam Constitutional:      General: She is not in acute distress. HENT:     Head: Normocephalic and atraumatic.  Eyes:     Conjunctiva/sclera: Conjunctivae normal.     Pupils: Pupils are equal, round, and reactive to light.  Cardiovascular:     Rate and Rhythm: Normal rate and regular rhythm.  Pulmonary:     Effort: Pulmonary effort is normal. No respiratory distress.  Abdominal:     General: There is no distension.     Tenderness: There is no abdominal tenderness.  Skin:    General: Skin is warm and dry.  Neurological:     General: No focal deficit present.     Mental Status: She is alert. Mental status is at baseline.   Psychiatric:        Mood and Affect: Mood normal.        Behavior: Behavior normal.     ED Results / Procedures / Treatments   Labs (all labs ordered are listed, but only abnormal results are displayed) Labs Reviewed  RESP PANEL BY RT-PCR (RSV, FLU A&B, COVID)  [id number]    EKG None  Radiology No results found.  Procedures Procedures    Medications Ordered in ED Medications - No data to display  ED Course/ Medical Decision Making/ A&P                           Medical Decision Making  Patient is here with cough and congestion a viral type syndrome.  Her COVID and viral test came back negative.  However her husband who is also a patient here tested positive for COVID.  I strongly suspect that she had a false negative test results, and does in fact have COVID, as they have identical symptoms that started around the same time.  I have a lower suspicion for bacterial infection.  She is not hypoxic requiring hospitalization or x-ray imaging at this time.  She does not appear clinically  dehydrated.  I would recommend quarantine precautions at home and follow-up with her PCP.  She verbalized understanding.  Angela Tucker was evaluated in Emergency Department on 07/01/2022 for the symptoms described in the history of present illness. She was evaluated in the context of the global COVID-19 pandemic, which necessitated consideration that the patient might be at risk for infection with the SARS-CoV-2 virus that causes COVID-19. Institutional protocols and algorithms that pertain to the evaluation of patients at risk for COVID-19 are in a state of rapid change based on information released by regulatory bodies including the CDC and federal and state organizations. These policies and algorithms were followed during the patient's care in the ED.         Final Clinical Impression(s) / ED Diagnoses Final diagnoses:  Upper respiratory tract infection, unspecified type    Rx / DC  Orders ED Discharge Orders     None         Tareek Sabo, Carola Rhine, MD 07/01/22 1231

## 2022-08-17 DIAGNOSIS — N951 Menopausal and female climacteric states: Secondary | ICD-10-CM | POA: Insufficient documentation

## 2023-05-13 ENCOUNTER — Ambulatory Visit: Payer: 59 | Attending: Family Medicine | Admitting: Family Medicine

## 2023-05-13 ENCOUNTER — Encounter: Payer: Self-pay | Admitting: Family Medicine

## 2023-05-13 DIAGNOSIS — Z13228 Encounter for screening for other metabolic disorders: Secondary | ICD-10-CM

## 2023-05-13 DIAGNOSIS — E66813 Obesity, class 3: Secondary | ICD-10-CM

## 2023-05-13 DIAGNOSIS — Z131 Encounter for screening for diabetes mellitus: Secondary | ICD-10-CM

## 2023-05-13 DIAGNOSIS — Z6841 Body Mass Index (BMI) 40.0 and over, adult: Secondary | ICD-10-CM | POA: Diagnosis not present

## 2023-05-13 DIAGNOSIS — Z7984 Long term (current) use of oral hypoglycemic drugs: Secondary | ICD-10-CM

## 2023-05-13 NOTE — Progress Notes (Signed)
Subjective:  Patient ID: Angela Tucker, female    DOB: 1971/01/25  Age: 52 y.o. MRN: [id number]  CC: New Patient (Initial Visit) (Weight loss)   HPI Mckenzee Kueber is a 52 y.o. year old female with a history of diet-controlled hypertension, hyperlipidemia, morbid obesity, arthritis, OSA (not on CPAP). She presents today to establish care.  Interval History: Discussed the use of AI scribe software for clinical note transcription with the patient, who gave verbal consent to proceed.  She presents to establish care and discuss weight loss. She has been taking metformin and Mounjaro for weight loss, but has discontinued Mounjaro due to cost. She reports that [facility] was effective in helping her lose weight, but since discontinuing it and only taking metformin, she has gained about five pounds. She has been trying to manage her weight by reducing her food intake and limiting her consumption of dark sodas to one a day. However, she admits that she does not engage in any physical activity outside of her part-time job, which involves standing and walking around. She expresses a desire to explore other weight loss options before considering surgery.        Past Medical History:  Diagnosis Date   Anemia    Arthritis    Gout    HLD (hyperlipidemia)    Hypertension    Lower extremity edema    OSA (obstructive sleep apnea)    Sleep apnea     Past Surgical History:  Procedure Laterality Date   BIOPSY  01/15/2022   Procedure: BIOPSY;  Surgeon: Shellia Cleverly, DO;  Location: [facility];  Service: Gastroenterology;;   CESAREAN SECTION     x 3   COLONOSCOPY WITH PROPOFOL N/A 01/15/2022   Procedure: COLONOSCOPY WITH PROPOFOL;  Surgeon: Shellia Cleverly, DO;  Location: [facility];  Service: Gastroenterology;  Laterality: N/A;   ESOPHAGOGASTRODUODENOSCOPY (EGD) WITH PROPOFOL N/A 01/15/2022   Procedure: ESOPHAGOGASTRODUODENOSCOPY (EGD) WITH PROPOFOL;  Surgeon: Shellia Cleverly, DO;   Location: [facility];  Service: Gastroenterology;  Laterality: N/A;   POLYPECTOMY  01/15/2022   Procedure: POLYPECTOMY;  Surgeon: Shellia Cleverly, DO;  Location: [facility];  Service: Gastroenterology;;   TUBAL LIGATION      Family History  Problem Relation Age of Onset   Cancer Mother 14       breast   Hypertension Father    Diabetes Father    Diabetes Paternal Grandmother    Hypertension Paternal Grandmother    Diabetes Paternal Grandfather    Hypertension Paternal Grandfather    Colon cancer Neg Hx    Rectal cancer Neg Hx    Stomach cancer Neg Hx    Esophageal cancer Neg Hx     Social History   Socioeconomic History   Marital status: Married    Spouse name: Julayne Shreiner   Number of children: 3   Years of education: Not on file   Highest education level: Not on file  Occupational History   Not on file  Tobacco Use   Smoking status: Never   Smokeless tobacco: Never  Vaping Use   Vaping status: Never Used  Substance and Sexual Activity   Alcohol use: Yes    Alcohol/week: 0.0 standard drinks of alcohol    Comment: occasional glass of wine   Drug use: No   Sexual activity: Yes  Other Topics Concern   Not on file  Social History Narrative   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   Married  3 children   Born 1997- Son Marina Goodell   2002- Wilder Glade daughter   2004- Marvis Moeller son   Masters in [employer]   Social Determinants of Health   Financial Resource Strain: Low Risk  (05/13/2023)   Overall Financial Resource Strain (CARDIA)    Difficulty of Paying Living Expenses: Not very hard  Food Insecurity: No Food Insecurity (05/13/2023)   Hunger Vital Sign    Worried About Running Out of Food in the Last Year: Never true    Ran Out of Food in the Last Year: Never true  Transportation Needs: No Transportation Needs (05/13/2023)   PRAPARE - Administrator, Civil Service (Medical): No    Lack of Transportation (Non-Medical): No  Physical  Activity: Inactive (05/13/2023)   Exercise Vital Sign    Days of Exercise per Week: 0 days    Minutes of Exercise per Session: 0 min  Stress: Stress Concern Present (05/13/2023)   [employer] of Occupational Health - Occupational Stress Questionnaire    Feeling of Stress : Very much  Social Connections: Socially Integrated (05/13/2023)   Social Connection and Isolation Panel [NHANES]    Frequency of Communication with Friends and Family: Twice a week    Frequency of Social Gatherings with Friends and Family: More than three times a week    Attends Religious Services: 1 to 4 times per year    Active Member of [employer] or Organizations: Yes    Attends Banker Meetings: 1 to 4 times per year    Marital Status: Married    Allergies  Allergen Reactions   Atorvastatin Nausea Only   Penicillin G Rash   Penicillins Hives and Rash    Outpatient Medications Prior to Visit  Medication Sig Dispense Refill   metFORMIN (GLUCOPHAGE) 500 MG tablet Take 500 mg by mouth daily.     Norethindrone Acetate-Ethinyl Estrad-FE (JUNEL FE 24) 1-20 MG-MCG(24) tablet Take 1 tablet by mouth daily.     tirzepatide (MOUNJARO) 10 MG/0.5ML Pen Inject 10 mg into the skin once a week. (Patient not taking: Reported on 05/13/2023) 2 mL 6   Vitamin D, Ergocalciferol, (DRISDOL) 1.25 MG (50000 UNIT) CAPS capsule Take 1 capsule (50,000 Units total) by mouth every 7 (seven) days. (Patient not taking: Reported on 05/13/2023) 12 capsule 0   No facility-administered medications prior to visit.     ROS Review of Systems  Constitutional:  Negative for activity change and appetite change.  HENT:  Negative for sinus pressure and sore throat.   Respiratory:  Negative for chest tightness, shortness of breath and wheezing.   Cardiovascular:  Negative for chest pain and palpitations.  Gastrointestinal:  Negative for abdominal distention, abdominal pain and constipation.  Genitourinary: Negative.    Musculoskeletal: Negative.   Psychiatric/Behavioral:  Negative for behavioral problems and dysphoric mood.     Objective:  BP 113/75 (BP Location: Left Arm, Patient Position: Sitting, Cuff Size: Large) Comment (Cuff Size): thigh cuff  Pulse 66   Ht 5\' 4"  (1.626 m)   Wt (!) 381 lb 9.6 oz (173.1 kg)   LMP  (LMP Unknown)   SpO2 98%   BMI 65.50 kg/m      05/13/2023    8:45 AM 07/01/2022   11:05 AM 07/01/2022   11:02 AM  BP/Weight  Systolic BP 113 129   Diastolic BP 75 72   Wt. (Lbs) 381.6  375  BMI 65.5 kg/m2  64.37 kg/m2      Physical Exam Constitutional:  Appearance: She is well-developed.  Cardiovascular:     Rate and Rhythm: Normal rate.     Heart sounds: Normal heart sounds. No murmur heard. Pulmonary:     Effort: Pulmonary effort is normal.     Breath sounds: Normal breath sounds. No wheezing or rales.  Chest:     Chest wall: No tenderness.  Abdominal:     General: Bowel sounds are normal. There is no distension.     Palpations: Abdomen is soft. There is no mass.     Tenderness: There is no abdominal tenderness.  Musculoskeletal:        General: Normal range of motion.     Right lower leg: No edema.     Left lower leg: No edema.  Neurological:     Mental Status: She is alert and oriented to person, place, and time.  Psychiatric:        Mood and Affect: Mood normal.        Latest Ref Rng & Units 10/16/2021   10:00 AM 09/24/2020    9:01 AM 04/08/2020   12:00 AM  CMP  Glucose 70 - 99 mg/dL 84  94  130   BUN 6 - 24 mg/dL 8  10  8    Creatinine 0.57 - 1.00 mg/dL 8.65  7.84  6.96   Sodium 134 - 144 mmol/L 142  142  142   Potassium 3.5 - 5.2 mmol/L 4.7  5.1  4.5   Chloride 96 - 106 mmol/L 105  105  103   CO2 20 - 29 mmol/L 25  22  30    Calcium 8.7 - 10.2 mg/dL 9.6  9.4  9.9   Total Protein 6.0 - 8.5 g/dL 6.2  6.3  6.6   Total Bilirubin 0.0 - 1.2 mg/dL 0.4  0.3  0.3   Alkaline Phos 44 - 121 IU/L 90  96  101   AST 0 - 40 IU/L 10  13  11    ALT 0 - 32  IU/L 9  24  11      Lipid Panel     Component Value Date/Time   CHOL 173 10/16/2021 1000   TRIG 75 10/16/2021 1000   HDL 43 10/16/2021 1000   CHOLHDL 4.7 (H) 09/24/2020 0901   CHOLHDL 5 03/21/2019 1147   VLDL 18.2 03/21/2019 1147   LDLCALC 116 (H) 10/16/2021 1000    CBC    Component Value Date/Time   WBC 9.5 10/16/2021 1000   WBC 11.0 (H) 03/21/2019 1147   RBC 5.06 10/16/2021 1000   RBC 4.42 03/21/2019 1147   HGB 11.4 10/16/2021 1000   HCT 38.3 10/16/2021 1000   PLT 465 (H) 10/16/2021 1000   MCV 76 (L) 10/16/2021 1000   MCH 22.5 (L) 10/16/2021 1000   MCH 24.0 (L) 11/17/2016 1732   MCHC 29.8 (L) 10/16/2021 1000   MCHC 31.5 03/21/2019 1147   RDW 16.4 (H) 10/16/2021 1000   LYMPHSABS 3.4 (H) 10/16/2021 1000   MONOABS 0.5 03/21/2019 1147   EOSABS 0.2 10/16/2021 1000   BASOSABS 0.1 10/16/2021 1000    Lab Results  Component Value Date   HGBA1C 5.5 10/16/2021    Assessment & Plan:      Morbid obesity Patient has been struggling with weight loss for a while. Previously used metformin and Mounjaro for weight loss, but Greggory Keen is no longer affordable. Patient has made dietary changes and is considering other options for weight loss. -Refer to medical weight management clinic for further assistance. -  Encourage regular exercise outside of work, aiming for more than 150 minutes per week. -Advise to reduce caloric intake to 1000-1200 calories per day. -Check A1c, kidney and liver functions, electrolytes, and blood count today. -Provide information on bariatric surgery for patient's consideration.    General Health Maintenance -Declined flu shot. -Recent mammogram and Pap smear done at another clinic. Request records from [facility] when she is able to provide the name of her clinician -Schedule physical exam for the beginning of the year. -Follow-up in six months to check on weight loss progress.          No orders of the defined types were placed in this  encounter.   Follow-up: Return in about 3 months (around 08/13/2023) for CPE/ Preventive Health Exam.       Hoy Register, MD, FAAFP. [facility] [place], [place] [phone]   05/13/2023, 9:36 AM

## 2023-05-13 NOTE — Patient Instructions (Signed)
Bariatric Surgery Information Bariatric surgery, also called weight-loss surgery, is a procedure that helps you lose weight. You may have bariatric surgery if: You have been unable to lose weight through diet and exercise. You have health problems caused by obesity, such as: Type 2 diabetes. Heart disease. Lung disease. How does bariatric surgery help me lose weight? Bariatric surgery helps you lose weight by: Decreasing how much food your body absorbs. This is done by closing off part of your stomach to make it smaller. This limits the amount of food your stomach can hold. Changing your body's regular digestive process so that food bypasses the parts of your body that absorb calories and nutrients. If you decide to have bariatric surgery, it is important to continue to eat a healthy diet and to exercise regularly after the surgery. What are the risks of bariatric surgery? As with any surgical procedure, each type of bariatric surgery has its own risks. These risks also depend on your age, your overall health, and any other medical conditions you may have. Risks of bariatric surgery can be divided into two groups. There are short-term risks and long-term risks. Short-term risks include: Infection. Bleeding. Allergic reactions to medicines or dyes. A blood clot that forms in the leg and travels to the heart or lungs. Leaking of digestive juices into the abdomen. Long-term risks and complications include: Not getting enough nutrients for your body (malnutrition). Narrowing of the digestive tract (stricture or stenosis). Diarrhea, nausea, or vomiting after eating (dumping syndrome). Failure of the device or procedure. This may require another surgery to correct the problem. When deciding on bariatric surgery, it is very important that you: Talk to your health care provider and choose the surgery that is best for you. Ask your health care provider about specific risks for the surgery you  choose. What are the different kinds of bariatric surgery? There are two kinds of bariatric surgeries: Restrictive surgery. This procedure makes your stomach smaller. It does not change your digestive process. The smaller the size of your new stomach, the less food you can eat. There are different types of restrictive surgeries. Malabsorptive surgery. This procedure makes your stomach smaller and alters your digestive process so that your body processes fewer calories and nutrients. These are the most common kind of bariatric surgery. There are different types of malabsorptive surgeries. What are the different types of restrictive surgery? Adjustable gastric banding In this procedure, an inflatable band is placed around your stomach near the upper end. This makes the passageway for food into the rest of your stomach much smaller. The band can be adjusted, making it tighter or looser, by filling it with salt solution. Your surgeon can adjust the band based on how you are feeling and how much weight you are losing. The band can be removed in the future. This requires another surgery. Sleeve gastrectomy In this procedure, your stomach is made smaller. This is done by surgically removing a large part of your stomach. When your stomach is smaller, you feel full more quickly and reduce how much you eat. What are the different types of malabsorptive surgery? Roux-en-Y gastric bypass (RGB) This is the most common weight-loss surgery. In this procedure, a small stomach pouch (gastric pouch) is created in the upper part of your stomach. Next, this gastric pouch is attached directly to the middle part of your small intestine. The farther down your small intestine the new connection is made, the fewer calories and nutrients you will absorb. This surgery  has the highest rate of complications. Biliopancreatic diversion with duodenal switch (BPD/DS) This is a multi-step procedure. First, a large part of your stomach  is removed, making your stomach smaller. Next, this smaller stomach is attached to the lower part of your small intestine. Like the RGB surgery, you absorb fewer calories and nutrients if your stomach is attached farther down the small intestine. Where to find more information American Society for Metabolic and Bariatric Surgery: www.asmbs.[employer] of Diabetes and Digestive and Kidney Diseases: CarFli[employer].tn Summary Bariatric surgery, also called weight-loss surgery, is a procedure that helps you lose weight. This surgery may be recommended if you have been unable to lose weight through diet and exercise, or you have health problems caused by obesity, such as type 2 diabetes, heart disease, or lung disease. Generally, risks of bariatric surgery include infection, bleeding, and failure of the surgery or device. Failure of the surgery or device may require another surgery to correct the problem. This information is not intended to replace advice given to you by your health care provider. Make sure you discuss any questions you have with your health care provider. Document Revised: 09/25/2020 Document Reviewed: 09/25/2020 Elsevier Patient Education  2024 [employer].

## 2023-05-14 LAB — CBC WITH DIFFERENTIAL/PLATELET
Basophils Absolute: 0.1 10*3/uL (ref 0.0–0.2)
Basos: 1 %
EOS (ABSOLUTE): 0.3 10*3/uL (ref 0.0–0.4)
Eos: 4 %
Hematocrit: 40.1 % (ref 34.0–46.6)
Hemoglobin: 12.5 g/dL (ref 11.1–15.9)
Immature Grans (Abs): 0.1 10*3/uL (ref 0.0–0.1)
Immature Granulocytes: 1 %
Lymphocytes Absolute: 3.2 10*3/uL — ABNORMAL HIGH (ref 0.7–3.1)
Lymphs: 36 %
MCH: 25.6 pg — ABNORMAL LOW (ref 26.6–33.0)
MCHC: 31.2 g/dL — ABNORMAL LOW (ref 31.5–35.7)
MCV: 82 fL (ref 79–97)
Monocytes Absolute: 0.4 10*3/uL (ref 0.1–0.9)
Monocytes: 5 %
Neutrophils Absolute: 4.9 10*3/uL (ref 1.4–7.0)
Neutrophils: 53 %
Platelets: 431 10*3/uL (ref 150–450)
RBC: 4.89 x10E6/uL (ref 3.77–5.28)
RDW: 14.6 % (ref 11.7–15.4)
WBC: 8.9 10*3/uL (ref 3.4–10.8)

## 2023-05-14 LAB — CMP14+EGFR
ALT: 11 [IU]/L (ref 0–32)
AST: 13 [IU]/L (ref 0–40)
Albumin: 3.8 g/dL (ref 3.8–4.9)
Alkaline Phosphatase: 107 [IU]/L (ref 44–121)
BUN/Creatinine Ratio: 12 (ref 9–23)
BUN: 10 mg/dL (ref 6–24)
Bilirubin Total: 0.3 mg/dL (ref 0.0–1.2)
CO2: 25 mmol/L (ref 20–29)
Calcium: 9.3 mg/dL (ref 8.7–10.2)
Chloride: 106 mmol/L (ref 96–106)
Creatinine, Ser: 0.86 mg/dL (ref 0.57–1.00)
Globulin, Total: 2.6 g/dL (ref 1.5–4.5)
Glucose: 92 mg/dL (ref 70–99)
Potassium: 4.4 mmol/L (ref 3.5–5.2)
Sodium: 143 mmol/L (ref 134–144)
Total Protein: 6.4 g/dL (ref 6.0–8.5)
eGFR: 81 mL/min/{1.73_m2} (ref >59)

## 2023-05-14 LAB — HEMOGLOBIN A1C
Est. average glucose Bld gHb Est-mCnc: 120 mg/dL
Hgb A1c MFr Bld: 5.8 % — ABNORMAL HIGH (ref 4.8–5.6)

## 2023-05-14 LAB — LP+NON-HDL CHOLESTEROL
Cholesterol, Total: 176 mg/dL (ref 100–199)
HDL: 43 mg/dL (ref >39)
LDL Chol Calc (NIH): 119 mg/dL — ABNORMAL HIGH (ref 0–99)
Total Non-HDL-Chol (LDL+VLDL): 133 mg/dL — ABNORMAL HIGH (ref 0–129)
Triglycerides: 77 mg/dL (ref 0–149)
VLDL Cholesterol Cal: 14 mg/dL (ref 5–40)

## 2023-08-02 ENCOUNTER — Ambulatory Visit: Payer: 59 | Admitting: Bariatrics

## 2023-08-02 ENCOUNTER — Encounter: Payer: Self-pay | Admitting: Bariatrics

## 2023-08-02 VITALS — BP 128/78 | HR 66 | Temp 97.5°F | Ht 63.5 in | Wt 378.0 lb

## 2023-08-02 DIAGNOSIS — E669 Obesity, unspecified: Secondary | ICD-10-CM | POA: Diagnosis not present

## 2023-08-02 DIAGNOSIS — R7303 Prediabetes: Secondary | ICD-10-CM

## 2023-08-02 DIAGNOSIS — Z6841 Body Mass Index (BMI) 40.0 and over, adult: Secondary | ICD-10-CM | POA: Diagnosis not present

## 2023-08-02 DIAGNOSIS — I10 Essential (primary) hypertension: Secondary | ICD-10-CM | POA: Diagnosis not present

## 2023-08-02 NOTE — Progress Notes (Signed)
Office: [phone]  /  Fax: [phone]   Initial Visit  Angela Tucker was seen in clinic today to evaluate for obesity. She is interested in losing weight to improve overall health and reduce the risk of weight related complications. She presents today to review program treatment options, initial physical assessment, and evaluation.     She was referred by: Self-Referral she states that she had been in our program at [facility] but it has been longer than 1 year.  She would like to resume the program.   When asked what else they would like to accomplish? She states: Adopt healthier eating patterns, Improve energy levels and physical activity, Improve existing medical conditions, Improve quality of life, and Improve appearance  When asked how has your weight affected you? She states: Contributed to orthopedic problems or mobility issues and Having fatigue  Some associated conditions: Hypertension, OSA, Prediabetes, and Vitamin D Deficiency  Contributing factors: Family history of obesity and Reduced physical activity  Weight promoting medications identified: None  Current nutrition plan: Portion control / smart choices  Current level of physical activity: walking, and has stepper.   Current or previous pharmacotherapy: Metformin and GLP-1 + GIP  Response to medication: Was cost prohibitive or lost coverage for AOM.   Past medical history includes:   Past Medical History:  Diagnosis Date   Anemia    Arthritis    Gout    HLD (hyperlipidemia)    Hypertension    Lower extremity edema    OSA (obstructive sleep apnea)    Sleep apnea      Objective:   BP 128/78   Pulse 66   Temp (!) 97.5 F (36.4 C)   Ht 5' 3.5" (1.613 m)   Wt (!) 378 lb (171.5 kg)   SpO2 98%   BMI 65.91 kg/m  She was weighed on the bioimpedance scale: Body mass index is 65.91 kg/m.  Peak [id number], Body Fat%:61.9 % , Visceral Fat Rating:30, Weight trend over the last 12 months:  Unchanged  General:  Alert, oriented and cooperative. Patient is in no acute distress.  Respiratory: Normal respiratory effort, no problems with respiration noted  Extremities: Normal range of motion.    Mental Status: Normal mood and affect. Normal behavior. Normal judgment and thought content.   DIAGNOSTIC DATA REVIEWED:  BMET    Component Value Date/Time   NA 143 05/13/2023 0931   K 4.4 05/13/2023 0931   CL 106 05/13/2023 0931   CO2 25 05/13/2023 0931   GLUCOSE 92 05/13/2023 0931   GLUCOSE 97 03/21/2019 1147   BUN 10 05/13/2023 0931   CREATININE 0.86 05/13/2023 0931   CALCIUM 9.3 05/13/2023 0931   GFRNONAA 90 04/08/2020 0000   GFRAA 103 04/08/2020 0000   Lab Results  Component Value Date   HGBA1C 5.8 (H) 05/13/2023   HGBA1C 6.4 (H) 08/29/2014   Lab Results  Component Value Date   INSULIN 11.3 10/16/2021   INSULIN 13.1 10/03/2019   CBC    Component Value Date/Time   WBC 8.9 05/13/2023 0931   WBC 11.0 (H) 03/21/2019 1147   RBC 4.89 05/13/2023 0931   RBC 4.42 03/21/2019 1147   HGB 12.5 05/13/2023 0931   HCT 40.1 05/13/2023 0931   PLT 431 05/13/2023 0931   MCV 82 05/13/2023 0931   MCH 25.6 (L) 05/13/2023 0931   MCH 24.0 (L) 11/17/2016 1732   MCHC 31.2 (L) 05/13/2023 0931   MCHC 31.5 03/21/2019 1147   RDW 14.6 05/13/2023 0931  Iron/TIBC/Ferritin/ %Sat    Component Value Date/Time   IRON 28 10/16/2021 1000   TIBC 301 10/16/2021 1000   FERRITIN 32 10/16/2021 1000   IRONPCTSAT 9 (LL) 10/16/2021 1000   Lipid Panel     Component Value Date/Time   CHOL 176 05/13/2023 0931   TRIG 77 05/13/2023 0931   HDL 43 05/13/2023 0931   CHOLHDL 4.7 (H) 09/24/2020 0901   CHOLHDL 5 03/21/2019 1147   VLDL 18.2 03/21/2019 1147   LDLCALC 119 (H) 05/13/2023 0931   Hepatic Function Panel     Component Value Date/Time   PROT 6.4 05/13/2023 0931   ALBUMIN 3.8 05/13/2023 0931   AST 13 05/13/2023 0931   ALT 11 05/13/2023 0931   ALKPHOS 107 05/13/2023 0931   BILITOT 0.3  05/13/2023 0931   BILIDIR 0.1 03/21/2019 1147      Component Value Date/Time   TSH 1.290 10/16/2021 1000     Assessment and Plan:   Hypertension Hypertension stable.  Medication(s): none  BP Readings from Last 3 Encounters:  08/02/23 128/78  05/13/23 113/75  07/01/22 129/72   Lab Results  Component Value Date   CREATININE 0.86 05/13/2023   CREATININE 0.80 10/16/2021   CREATININE 0.90 09/24/2020   Lab Results  Component Value Date   GFR 88.46 03/21/2019   GFR 108.77 02/19/2016   GFR 100.01 10/17/2014    Plan: No added salt. Will keep sodium content to 1,500 mg or less per day.    Prediabetes Last A1c was 5.8  Medication(s): none, previously on Metformin and Mounjaro.  Lab Results  Component Value Date   HGBA1C 5.8 (H) 05/13/2023   HGBA1C 5.5 10/16/2021   HGBA1C 5.6 09/24/2020   HGBA1C 5.8 (H) 03/21/2020   HGBA1C 5.8 (H) 10/03/2019   Lab Results  Component Value Date   INSULIN 11.3 10/16/2021   INSULIN 15.3 09/24/2020   INSULIN 13.1 10/03/2019    Plan: Will minimize all refined carbohydrates both sweets and starches.  Will begin on a plan for dietary changes and exercise.  Consider both aerobic and resistance training.  Will keep protein, water, and fiber intake high.  Increase Polyunsaturated and Monounsaturated fats to increase satiety and encourage weight loss.  Aim for 7 to 9 hours of sleep nightly.      Morbid Obesity: Current BMI 65    Obesity Treatment / Action Plan:  Patient will work on garnering support from family and friends to begin weight loss journey. Will work on eliminating or reducing the presence of highly palatable, calorie dense foods in the home. Will complete provided nutritional and psychosocial assessment questionnaire before the next appointment. Will be scheduled for indirect calorimetry to determine resting energy expenditure in a fasting state.  This will allow [place] to create a reduced calorie, high-protein meal plan to  promote loss of fat mass while preserving muscle mass. Counseled on the health benefits of losing 5%-15% of total body weight. Was counseled on nutritional approaches to weight loss and benefits of reducing processed foods and consuming plant-based foods and high quality protein as part of nutritional weight management. Was counseled on pharmacotherapy and role as an adjunct in weight management.   Obesity Education Performed Today:  She was weighed on the bioimpedance scale and results were discussed and documented in the synopsis.  We discussed obesity as a disease and the importance of a more detailed evaluation of all the factors contributing to the disease.  We discussed the importance of long term lifestyle changes which  include nutrition, exercise and behavioral modifications as well as the importance of customizing this to her specific health and social needs.  We discussed the benefits of reaching a healthier weight to alleviate the symptoms of existing conditions and reduce the risks of the biomechanical, metabolic and psychological effects of obesity.  Discussed New Patient/Late Arrival, and Cancellation Policies. Patient voiced understanding and allowed to ask questions.   Angela Tucker appears to be in the action stage of change and states they are ready to start intensive lifestyle modifications and behavioral modifications.  Reviewed by clinician on day of visit: allergies, medications, problem list, medical history, surgical history, family history, social history, and previous encounter notes.    Adilene Areola A. Manson Passey D.O.    Office: [phone]  /  Fax: [phone]

## 2023-08-17 ENCOUNTER — Ambulatory Visit: Payer: 59 | Attending: Family Medicine | Admitting: Family Medicine

## 2023-08-17 ENCOUNTER — Encounter: Payer: Self-pay | Admitting: Family Medicine

## 2023-08-17 VITALS — BP 127/83 | HR 76 | Ht 63.5 in | Wt 386.0 lb

## 2023-08-17 DIAGNOSIS — E559 Vitamin D deficiency, unspecified: Secondary | ICD-10-CM

## 2023-08-17 DIAGNOSIS — Z6841 Body Mass Index (BMI) 40.0 and over, adult: Secondary | ICD-10-CM

## 2023-08-17 DIAGNOSIS — R5383 Other fatigue: Secondary | ICD-10-CM

## 2023-08-17 DIAGNOSIS — Z0001 Encounter for general adult medical examination with abnormal findings: Secondary | ICD-10-CM | POA: Diagnosis not present

## 2023-08-17 DIAGNOSIS — Z23 Encounter for immunization: Secondary | ICD-10-CM

## 2023-08-17 DIAGNOSIS — E66813 Obesity, class 3: Secondary | ICD-10-CM

## 2023-08-17 DIAGNOSIS — Z7984 Long term (current) use of oral hypoglycemic drugs: Secondary | ICD-10-CM

## 2023-08-17 NOTE — Progress Notes (Signed)
 Subjective:  Patient ID: Angela Tucker, female    DOB: [birth year]  Age: 53 y.o. MRN: [id number]  CC: Annual Exam   HPI Rufus Cypert is a 53 y.o. year old female with a history of diet-controlled hypertension, hyperlipidemia, morbid obesity, arthritis, OSA (not on CPAP).   Interval History: Discussed the use of AI scribe software for clinical note transcription with the patient, who gave verbal consent to proceed.   The patient presents for a general check-up, initially misunderstanding the appointment to be a full physical including a Pap smear. She has a Pap smear scheduled with her OBGYN next week. She reports feeling 'exhausted, tired' and not feeling rested even after sleeping. This fatigue is a new symptom for her. She has a history of low vitamin D , which was last checked in 2023. She also reports a need to lose weight and has recently started attending a weight clinic. She has a new stepper for exercise but can only use it for about five minutes at a time currently. She reports eating a yogurt for breakfast and having meals later in the day, but does not specify the amount of fruits and vegetables in her diet.        Past Medical History:  Diagnosis Date   Anemia    Arthritis    Gout    HLD (hyperlipidemia)    Hypertension    Lower extremity edema    OSA (obstructive sleep apnea)    Sleep apnea     Past Surgical History:  Procedure Laterality Date   BIOPSY  01/15/2022   Procedure: BIOPSY;  Surgeon: San Sandor GAILS, DO;  Location: [facility];  Service: Gastroenterology;;   CESAREAN SECTION     x 3   COLONOSCOPY WITH PROPOFOL  N/A 01/15/2022   Procedure: COLONOSCOPY WITH PROPOFOL ;  Surgeon: San Sandor GAILS, DO;  Location: [facility];  Service: Gastroenterology;  Laterality: N/A;   ESOPHAGOGASTRODUODENOSCOPY (EGD) WITH PROPOFOL  N/A 01/15/2022   Procedure: ESOPHAGOGASTRODUODENOSCOPY (EGD) WITH PROPOFOL ;  Surgeon: San Sandor GAILS, DO;  Location: [facility];   Service: Gastroenterology;  Laterality: N/A;   POLYPECTOMY  01/15/2022   Procedure: POLYPECTOMY;  Surgeon: San Sandor GAILS, DO;  Location: [facility];  Service: Gastroenterology;;   TUBAL LIGATION      Family History  Problem Relation Age of Onset   Cancer Mother 61       breast   Hypertension Father    Diabetes Father    Diabetes Paternal Grandmother    Hypertension Paternal Grandmother    Diabetes Paternal Grandfather    Hypertension Paternal Grandfather    Colon cancer Neg Hx    Rectal cancer Neg Hx    Stomach cancer Neg Hx    Esophageal cancer Neg Hx     Social History   Socioeconomic History   Marital status: Married    Spouse name: Jaleeyah Munce   Number of children: 3   Years of education: Not on file   Highest education level: Not on file  Occupational History   Not on file  Tobacco Use   Smoking status: Never   Smokeless tobacco: Never  Vaping Use   Vaping status: Never Used  Substance and Sexual Activity   Alcohol use: Yes    Alcohol/week: 0.0 standard drinks of alcohol    Comment: occasional glass of wine   Drug use: No   Sexual activity: Yes  Other Topics Concern   Not on file  Social History Narrative   Works at [employer]  Care- works in Leisure centre manager   Married   3 children   Born 1997- Son Abran   2002- Conni daughter   2004- Bu[place] son   Masters in [employer]   Social Drivers of [employer]: Low Risk  (05/13/2023)   Overall Financial Resource Strain (CARDIA)    Difficulty of Paying Living Expenses: Not very hard  Food Insecurity: No Food Insecurity (05/13/2023)   Hunger Vital Sign    Worried About Running Out of Food in the Last Year: Never true    Ran Out of Food in the Last Year: Never true  Transportation Needs: No Transportation Needs (05/13/2023)   PRAPARE - Administrator, Civil Service (Medical): No    Lack of Transportation (Non-Medical): No  Physical Activity: Inactive (05/13/2023)    Exercise Vital Sign    Days of Exercise per Week: 0 days    Minutes of Exercise per Session: 0 min  Stress: Stress Concern Present (05/13/2023)   [employer] of Occupational Health - Occupational Stress Questionnaire    Feeling of Stress : Very much  Social Connections: Socially Integrated (05/13/2023)   Social Connection and Isolation Panel [NHANES]    Frequency of Communication with Friends and Family: Twice a week    Frequency of Social Gatherings with Friends and Family: More than three times a week    Attends Religious Services: 1 to 4 times per year    Active Member of [employer] or Organizations: Yes    Attends Banker Meetings: 1 to 4 times per year    Marital Status: Married    Allergies  Allergen Reactions   Atorvastatin  Nausea Only   Penicillin G Rash   Penicillins Hives and Rash    Outpatient Medications Prior to Visit  Medication Sig Dispense Refill   Norethindrone Acetate-Ethinyl Estrad-FE (JUNEL FE 24) 1-20 MG-MCG(24) tablet Take 1 tablet by mouth daily.     Vitamin D , Ergocalciferol , (DRISDOL ) 1.25 MG (50000 UNIT) CAPS capsule Take 1 capsule (50,000 Units total) by mouth every 7 (seven) days. 12 capsule 0   metFORMIN  (GLUCOPHAGE ) 500 MG tablet Take 500 mg by mouth daily.     No facility-administered medications prior to visit.     ROS Review of Systems  Constitutional:  Positive for fatigue. Negative for activity change and appetite change.  HENT:  Negative for sinus pressure and sore throat.   Respiratory:  Negative for chest tightness, shortness of breath and wheezing.   Cardiovascular:  Negative for chest pain and palpitations.  Gastrointestinal:  Negative for abdominal distention, abdominal pain and constipation.  Genitourinary: Negative.   Musculoskeletal: Negative.   Psychiatric/Behavioral:  Negative for behavioral problems and dysphoric mood.     Objective:  BP 127/83   Pulse 76   Ht 5' 3.5 (1.613 m)   Wt (!) 386 lb (175.1 kg)    SpO2 97%   BMI 67.30 kg/m      08/17/2023   10:41 AM 08/02/2023    2:00 PM 05/13/2023    8:45 AM  BP/Weight  Systolic BP 127 128 113  Diastolic BP 83 78 75  Wt. (Lbs) 386 378 381.6  BMI 67.3 kg/m2 65.91 kg/m2 65.5 kg/m2      Physical Exam Constitutional:      General: She is not in acute distress.    Appearance: She is well-developed. She is obese. She is not diaphoretic.  HENT:     Head: Normocephalic.     Right  Ear: External ear normal.     Left Ear: External ear normal.     Nose: Nose normal.     Mouth/Throat:     Mouth: Mucous membranes are moist.  Eyes:     Extraocular Movements: Extraocular movements intact.     Conjunctiva/sclera: Conjunctivae normal.     Pupils: Pupils are equal, round, and reactive to light.  Neck:     Vascular: No JVD.  Cardiovascular:     Rate and Rhythm: Normal rate and regular rhythm.     Pulses: Normal pulses.     Heart sounds: Normal heart sounds. No murmur heard.    No gallop.  Pulmonary:     Effort: Pulmonary effort is normal. No respiratory distress.     Breath sounds: Normal breath sounds. No wheezing or rales.  Chest:     Chest wall: No tenderness.  Abdominal:     General: Bowel sounds are normal. There is no distension.     Palpations: Abdomen is soft. There is no mass.     Tenderness: There is no abdominal tenderness.  Musculoskeletal:        General: No tenderness. Normal range of motion.     Cervical back: Normal range of motion.  Skin:    General: Skin is warm and dry.  Neurological:     Mental Status: She is alert and oriented to person, place, and time.     Deep Tendon Reflexes: Reflexes are normal and symmetric.  Psychiatric:        Mood and Affect: Mood normal.        Latest Ref Rng & Units 05/13/2023    9:31 AM 10/16/2021   10:00 AM 09/24/2020    9:01 AM  CMP  Glucose 70 - 99 mg/dL 92  84  94   BUN 6 - 24 mg/dL 10  8  10    Creatinine 0.57 - 1.00 mg/dL 9.13  9.19  9.09   Sodium 134 - 144 mmol/L 143  142   142   Potassium 3.5 - 5.2 mmol/L 4.4  4.7  5.1   Chloride 96 - 106 mmol/L 106  105  105   CO2 20 - 29 mmol/L 25  25  22    Calcium  8.7 - 10.2 mg/dL 9.3  9.6  9.4   Total Protein 6.0 - 8.5 g/dL 6.4  6.2  6.3   Total Bilirubin 0.0 - 1.2 mg/dL 0.3  0.4  0.3   Alkaline Phos 44 - 121 IU/L 107  90  96   AST 0 - 40 IU/L 13  10  13    ALT 0 - 32 IU/L 11  9  24      Lipid Panel     Component Value Date/Time   CHOL 176 05/13/2023 0931   TRIG 77 05/13/2023 0931   HDL 43 05/13/2023 0931   CHOLHDL 4.7 (H) 09/24/2020 0901   CHOLHDL 5 03/21/2019 1147   VLDL 18.2 03/21/2019 1147   LDLCALC 119 (H) 05/13/2023 0931    CBC    Component Value Date/Time   WBC 8.9 05/13/2023 0931   WBC 11.0 (H) 03/21/2019 1147   RBC 4.89 05/13/2023 0931   RBC 4.42 03/21/2019 1147   HGB 12.5 05/13/2023 0931   HCT 40.1 05/13/2023 0931   PLT 431 05/13/2023 0931   MCV 82 05/13/2023 0931   MCH 25.6 (L) 05/13/2023 0931   MCH 24.0 (L) 11/17/2016 1732   MCHC 31.2 (L) 05/13/2023 0931   MCHC 31.5 03/21/2019 1147  RDW 14.6 05/13/2023 0931   LYMPHSABS 3.2 (H) 05/13/2023 0931   MONOABS 0.5 03/21/2019 1147   EOSABS 0.3 05/13/2023 0931   BASOSABS 0.1 05/13/2023 0931    Lab Results  Component Value Date   HGBA1C 5.8 (H) 05/13/2023    Lab Results  Component Value Date   TSH 1.290 10/16/2021    Assessment & Plan:  Annual physical exam with abnormal findings -Counseled on 150 minutes of exercise per week, healthy eating (including decreased daily intake of saturated fats, cholesterol, added sugars, sodium), STI prevention, routine healthcare maintenance.     Fatigue Recent onset of fatigue. Previous normal iron levels. Low vitamin D  in 2023. -Order Vitamin D  and thyroid  function tests. -If Vitamin D  is low, prescribe Vitamin D  supplementation. -Underlying history of sleep apnea currently not using CPAP machine and this could be contributing to her fatigue as well.  Weight Management Patient is currently  attending a weight loss clinic. -Encourage continuation of weight loss clinic visits. -Encourage increased physical activity and consumption of fruits and vegetables.  Shingles Vaccination Due for second dose of shingles vaccine. -Administer second dose of shingles vaccine today.  General Health Maintenance -Pap smear and mammogram scheduled with OBGYN next week. -Plan for annual physical with complete blood work next year. -Encourage patient to make an appointment for any new concerns or issues.          No orders of the defined types were placed in this encounter.   Follow-up: Return in about 1 year (around 08/16/2024) for CPE/ Preventive Health Exam.       Corrina Sabin, MD, FAAFP. [facility] [place], [place] [phone]   08/17/2023, 11:39 AM

## 2023-08-17 NOTE — Patient Instructions (Addendum)

## 2023-08-18 ENCOUNTER — Encounter: Payer: Self-pay | Admitting: Family Medicine

## 2023-08-18 ENCOUNTER — Other Ambulatory Visit: Payer: Self-pay | Admitting: Family Medicine

## 2023-08-18 DIAGNOSIS — E559 Vitamin D deficiency, unspecified: Secondary | ICD-10-CM

## 2023-08-18 LAB — T3: T3, Total: 141 ng/dL (ref 71–180)

## 2023-08-18 LAB — VITAMIN D 25 HYDROXY (VIT D DEFICIENCY, FRACTURES): Vit D, 25-Hydroxy: 21.5 ng/mL — ABNORMAL LOW (ref 30.0–100.0)

## 2023-08-18 LAB — TSH: TSH: 1.38 u[IU]/mL (ref 0.450–4.500)

## 2023-08-18 LAB — T4, FREE: Free T4: 1.17 ng/dL (ref 0.82–1.77)

## 2023-08-18 MED ORDER — VITAMIN D (ERGOCALCIFEROL) 1.25 MG (50000 UNIT) PO CAPS: 50000.0000 [IU] | ORAL_CAPSULE | ORAL | 1 refills | Status: AC

## 2024-05-16 ENCOUNTER — Ambulatory Visit: Admitting: Family Medicine

## 2024-06-19 ENCOUNTER — Ambulatory Visit: Admitting: Family Medicine

## 2024-08-15 ENCOUNTER — Telehealth: Payer: Self-pay | Admitting: Family Medicine

## 2024-08-15 NOTE — Telephone Encounter (Signed)
 Contacted pt confirmed appt

## 2024-08-16 ENCOUNTER — Encounter: Payer: Self-pay | Admitting: Family Medicine

## 2024-08-16 ENCOUNTER — Ambulatory Visit: Admitting: Family Medicine

## 2024-08-16 VITALS — BP 157/82 | HR 73 | Temp 98.2°F | Ht 63.5 in | Wt 397.2 lb

## 2024-08-16 DIAGNOSIS — G4733 Obstructive sleep apnea (adult) (pediatric): Secondary | ICD-10-CM | POA: Diagnosis not present

## 2024-08-16 DIAGNOSIS — Z0001 Encounter for general adult medical examination with abnormal findings: Secondary | ICD-10-CM | POA: Diagnosis not present

## 2024-08-16 DIAGNOSIS — I10 Essential (primary) hypertension: Secondary | ICD-10-CM | POA: Diagnosis not present

## 2024-08-16 DIAGNOSIS — R7303 Prediabetes: Secondary | ICD-10-CM

## 2024-08-16 DIAGNOSIS — Z6841 Body Mass Index (BMI) 40.0 and over, adult: Secondary | ICD-10-CM

## 2024-08-16 MED ORDER — WEGOVY 0.25 MG/0.5ML ~~LOC~~ SOAJ: 0.2500 mg | SUBCUTANEOUS | 0 refills | Status: AC

## 2024-08-16 MED ORDER — WEGOVY 0.5 MG/0.5ML ~~LOC~~ SOAJ: 0.5000 mg | SUBCUTANEOUS | 1 refills | Status: AC

## 2024-08-16 NOTE — Patient Instructions (Signed)
 VISIT SUMMARY:  During your visit today, we discussed your elevated blood pressure, weight management, and overall health maintenance. We reviewed your current health status and made plans for further evaluation and treatment.  YOUR PLAN:  -MORBID OBESITY: Morbid obesity means having a body mass index (BMI) of 40 or higher, which can lead to other health issues. We discussed your interest in weight loss medications and checked your insurance coverage for Wegovy  and Saxenda. We also sent a message to the pharmacist to explore coverage options. If you decide to pursue surgery, we can refer you to a bariatric surgeon.  -OBSTRUCTIVE SLEEP APNEA: Obstructive sleep apnea is a condition where your breathing stops and starts during sleep. This may affect your insurance coverage for weight loss medications. We included this condition in the prior authorization documentation for weight loss medications.  -PREDIABETES: Prediabetes means your blood sugar levels are higher than normal but not high enough to be diagnosed as diabetes. We ordered an A1c test to assess your current status and will discuss the results and potential treatment options based on the results.  -HYPERTENSION: Hypertension means high blood pressure. Your blood pressure was elevated today, and we discussed that weight loss may help improve control. We repeated your blood pressure measurement before you left and advised you to cut down on salt intake and processed foods.  -GENERAL HEALTH MAINTENANCE: We discussed routine health maintenance, including preventive screenings and lifestyle modifications. We ordered a lipid panel, comprehensive metabolic panel, and A1c test. We also encouraged you to have regular dental and eye check-ups and advised you to increase your intake of fruits and vegetables.  INSTRUCTIONS:  Please follow up with the recommended blood tests, including the A1c test, lipid panel, and comprehensive metabolic panel. We will  discuss the results and any necessary treatment options at your next visit. Continue to monitor your blood pressure at home and maintain a healthy diet with reduced salt intake. Consider the options for weight loss medications or surgery based on your insurance coverage and preferences. Regular dental and eye check-ups are also important for your overall health.

## 2024-08-16 NOTE — Progress Notes (Signed)
 "  Subjective:  Patient ID: Angela Tucker, female    DOB: 27-Nov-1970  Age: 54 y.o. MRN: [id number]  CC: Annual Exam     Discussed the use of AI scribe software for clinical note transcription with the patient, who gave verbal consent to proceed.  History of Present Illness Angela Tucker is a 54 year old female with hypertension and prediabetes who presents for an annual physical exam and has an elevated blood pressure.  Her blood pressure was elevated this morning, which she attributes to rushing. Hypertension has been diet-controlled and she is not on antihypertensive medication.  She attends a weight management clinic but has not tolerated metformin . She is interested in appetite suppressants or other weight loss medications, though her insurance does not cover Wegovy  or Ozempic . 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  She has prediabetes and is due for an A1c. She has not eaten today and is available for fasting blood work. Her last labs were in 2024.  She has obstructive sleep apnea, which is being followed along with her weight management at three-month intervals.  She does not do structured exercise but walks extensively at work, which contributes to leg discomfort. She usually eats fruits and vegetables but reports less healthy intake over the past few weeks.   Tinsleigh Kerstein's baseline weight and BMI: Weight 397lbs lbs; Date 08/16/24 BM I, 69.26 Date2/4/26 The patient is 18 years or older:yes BMI is greater than or equal to 30 kg/m yes BMI is greater than or equal to 27 kg/m yes There is presence of at least one weight related comorbidity/risk factor/complication of ( ie hypertension, OSA, CVD, dyslipidemia, type 2 diabetes mellitus): Yes - Hypertension, OSA, Pre Diabetes Darria Bricco is currently on and will continue lifestyle modifications including structured nutritional and physical  activity. The patient will not be using the requested agent concurrently with another GLP-1 Kolina Horsman does not have any FDA label contraindications to the requested agent including pregnancy, lactation, history of medullary thyroid  cancer or multiple endocrine neoplasia type II.   Past Medical History:  Diagnosis Date   Anemia    Arthritis    Gout    HLD (hyperlipidemia)    Hypertension    Lower extremity edema    OSA (obstructive sleep apnea)    Sleep apnea     Past Surgical History:  Procedure Laterality Date   BIOPSY  01/15/2022   Procedure: BIOPSY;  Surgeon: San Sandor GAILS, DO;  Location: [facility];  Service: Gastroenterology;;   CESAREAN SECTION     x 3   COLONOSCOPY WITH PROPOFOL  N/A 01/15/2022   Procedure: COLONOSCOPY WITH PROPOFOL ;  Surgeon: San Sandor GAILS, DO;  Location: [facility];  Service: Gastroenterology;  Laterality: N/A;   ESOPHAGOGASTRODUODENOSCOPY (EGD) WITH PROPOFOL  N/A 01/15/2022   Procedure: ESOPHAGOGASTRODUODENOSCOPY (EGD) WITH PROPOFOL ;  Surgeon: San Sandor GAILS, DO;  Location: [facility];  Service: Gastroenterology;  Laterality: N/A;   POLYPECTOMY  01/15/2022   Procedure: POLYPECTOMY;  Surgeon: San Sandor GAILS, DO;  Location: [facility];  Service: Gastroenterology;;   TUBAL LIGATION      Family History  Problem Relation Age of Onset   Cancer Mother 61       breast   Hypertension Father    Diabetes Father    Diabetes Paternal Grandmother    Hypertension Paternal Grandmother    Diabetes Paternal Grandfather    Hypertension Paternal Grandfather    Colon cancer Neg  Hx    Rectal cancer Neg Hx    Stomach cancer Neg Hx    Esophageal cancer Neg Hx     Social History   Socioeconomic History   Marital status: Married    Spouse name: Angela Tucker   Number of children: 3   Years of education: Not on file   Highest education level: Not on file  Occupational History   Not on file  Tobacco Use   Smoking status: Never    Smokeless tobacco: Never  Vaping Use   Vaping status: Never Used  Substance and Sexual Activity   Alcohol use: Yes    Alcohol/week: 0.0 standard drinks of alcohol    Comment: occasional glass of wine   Drug use: No   Sexual activity: Yes  Other Topics Concern   Not on file  Social History Narrative   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   Married   3 children   Born 1997- Son Abran   2002- Zian daughter   2004- Buren son   Masters in [employer]   Social Drivers of Health   Tobacco Use: Low Risk (08/16/2024)   Patient History    Smoking Tobacco Use: Never    Smokeless Tobacco Use: Never    Passive Exposure: Not on file  Financial Resource Strain: Low Risk (05/13/2023)   Overall Financial Resource Strain (CARDIA)    Difficulty of Paying Living Expenses: Not very hard  Food Insecurity: No Food Insecurity (05/13/2023)   Hunger Vital Sign    Worried About Running Out of Food in the Last Year: Never true    Ran Out of Food in the Last Year: Never true  Transportation Needs: No Transportation Needs (05/13/2023)   PRAPARE - Administrator, Civil Service (Medical): No    Lack of Transportation (Non-Medical): No  Physical Activity: Inactive (05/13/2023)   Exercise Vital Sign    Days of Exercise per Week: 0 days    Minutes of Exercise per Session: 0 min  Stress: Stress Concern Present (05/13/2023)   [employer] of Occupational Health - Occupational Stress Questionnaire    Feeling of Stress : Very much  Social Connections: Socially Integrated (05/13/2023)   Social Connection and Isolation Panel    Frequency of Communication with Friends and Family: Twice a week    Frequency of Social Gatherings with Friends and Family: More than three times a week    Attends Religious Services: 1 to 4 times per year    Active Member of Clubs or Organizations: Yes    Attends Banker Meetings: 1 to 4 times per year    Marital Status: Married   Depression (PHQ2-9): Low Risk (08/16/2024)   Depression (PHQ2-9)    PHQ-2 Score: 3  Alcohol Screen: Not on file  Housing: Low Risk (05/13/2023)   Housing    Last Housing Risk Score: 0  Utilities: Not At Risk (05/13/2023)   AHC Utilities    Threatened with loss of utilities: No  Health Literacy: Adequate Health Literacy (05/13/2023)   B1300 Health Literacy    Frequency of need for help with medical instructions: Never    Allergies[1]  Outpatient Medications Prior to Visit  Medication Sig Dispense Refill   metFORMIN  (GLUCOPHAGE ) 500 MG tablet Take 500 mg by mouth daily.     Norethindrone Acetate-Ethinyl Estrad-FE (JUNEL FE 24) 1-20 MG-MCG(24) tablet Take 1 tablet by mouth daily. (Patient not taking: Reported on 08/16/2024)     Vitamin  D, Ergocalciferol , (DRISDOL ) 1.25 MG (50000 UNIT) CAPS capsule Take 1 capsule (50,000 Units total) by mouth every 7 (seven) days. (Patient not taking: Reported on 08/16/2024) 12 capsule 1   No facility-administered medications prior to visit.     ROS Review of Systems  Constitutional:  Negative for activity change and appetite change.  HENT:  Negative for sinus pressure and sore throat.   Respiratory:  Negative for chest tightness, shortness of breath and wheezing.   Cardiovascular:  Negative for chest pain and palpitations.  Gastrointestinal:  Negative for abdominal distention, abdominal pain and constipation.  Genitourinary: Negative.   Musculoskeletal: Negative.   Psychiatric/Behavioral:  Negative for behavioral problems and dysphoric mood.     Objective:  BP (!) 157/82   Pulse 73   Temp 98.2 F (36.8 C) (Oral)   Ht 5' 3.5 (1.613 m)   Wt (!) 397 lb 3.2 oz (180.2 kg)   SpO2 97%   BMI 69.26 kg/m      08/16/2024    9:48 AM 08/16/2024    9:05 AM 08/17/2023   10:41 AM  BP/Weight  Systolic BP 157 155 127  Diastolic BP 82 84 83  Wt. (Lbs)  397.2 386  BMI  69.26 kg/m2 67.3 kg/m2      Physical Exam Constitutional:      General: She is  not in acute distress.    Appearance: She is well-developed. She is obese. She is not diaphoretic.  HENT:     Head: Normocephalic.     Right Ear: External ear normal.     Left Ear: External ear normal.     Nose: Nose normal.     Mouth/Throat:     Mouth: Mucous membranes are moist.  Eyes:     Extraocular Movements: Extraocular movements intact.     Conjunctiva/sclera: Conjunctivae normal.     Pupils: Pupils are equal, round, and reactive to light.  Neck:     Vascular: No JVD.  Cardiovascular:     Rate and Rhythm: Normal rate and regular rhythm.     Pulses: Normal pulses.     Heart sounds: Normal heart sounds. No murmur heard.    No gallop.  Pulmonary:     Effort: Pulmonary effort is normal. No respiratory distress.     Breath sounds: Normal breath sounds. No wheezing or rales.  Chest:     Chest wall: No tenderness.  Abdominal:     General: Bowel sounds are normal. There is no distension.     Palpations: Abdomen is soft. There is no mass.     Tenderness: There is no abdominal tenderness.  Musculoskeletal:        General: No tenderness. Normal range of motion.     Cervical back: Normal range of motion.  Skin:    General: Skin is warm and dry.  Neurological:     Mental Status: She is alert and oriented to person, place, and time.     Deep Tendon Reflexes: Reflexes are normal and symmetric.  Psychiatric:        Mood and Affect: Mood normal.        Latest Ref Rng & Units 05/13/2023    9:31 AM 10/16/2021   10:00 AM 09/24/2020    9:01 AM  CMP  Glucose 70 - 99 mg/dL 92  84  94   BUN 6 - 24 mg/dL 10  8  10    Creatinine 0.57 - 1.00 mg/dL 9.13  9.19  9.09   Sodium 134 -  144 mmol/L 143  142  142   Potassium 3.5 - 5.2 mmol/L 4.4  4.7  5.1   Chloride 96 - 106 mmol/L 106  105  105   CO2 20 - 29 mmol/L 25  25  22    Calcium  8.7 - 10.2 mg/dL 9.3  9.6  9.4   Total Protein 6.0 - 8.5 g/dL 6.4  6.2  6.3   Total Bilirubin 0.0 - 1.2 mg/dL 0.3  0.4  0.3   Alkaline Phos 44 - 121 IU/L 107   90  96   AST 0 - 40 IU/L 13  10  13    ALT 0 - 32 IU/L 11  9  24      Lipid Panel     Component Value Date/Time   CHOL 176 05/13/2023 0931   TRIG 77 05/13/2023 0931   HDL 43 05/13/2023 0931   CHOLHDL 4.7 (H) 09/24/2020 0901   CHOLHDL 5 03/21/2019 1147   VLDL 18.2 03/21/2019 1147   LDLCALC 119 (H) 05/13/2023 0931    CBC    Component Value Date/Time   WBC 8.9 05/13/2023 0931   WBC 11.0 (H) 03/21/2019 1147   RBC 4.89 05/13/2023 0931   RBC 4.42 03/21/2019 1147   HGB 12.5 05/13/2023 0931   HCT 40.1 05/13/2023 0931   PLT 431 05/13/2023 0931   MCV 82 05/13/2023 0931   MCH 25.6 (L) 05/13/2023 0931   MCH 24.0 (L) 11/17/2016 1732   MCHC 31.2 (L) 05/13/2023 0931   MCHC 31.5 03/21/2019 1147   RDW 14.6 05/13/2023 0931   LYMPHSABS 3.2 (H) 05/13/2023 0931   MONOABS 0.5 03/21/2019 1147   EOSABS 0.3 05/13/2023 0931   BASOSABS 0.1 05/13/2023 0931    Lab Results  Component Value Date   HGBA1C 5.8 (H) 05/13/2023       Assessment & Plan Annual visit for adult general medical exam with abnormal findings Routine health maintenance discussed, including preventive screenings and lifestyle modifications. -She is due for breast and cervical cancer screenings but states she undergoes this with her GYN.  She will need to call them to schedule - Ordered lipid panel, comprehensive metabolic panel, and A1c test. - Encouraged regular dental and eye check-ups. - Advised on increasing intake of fruits and vegetables.   Morbid obesity Contributing to knee pain and activity limitations. Interested in weight loss medications but hindered by insurance. Open to surgery but prefers medication first. - Check insurance coverage for GLP-1 medications with pharmacy - Discussed potential referral to bariatric surgeon if she decides to pursue surgery.  Obstructive sleep apnea Does not use a CPAP May affect insurance coverage for weight loss medications. - Included obstructive sleep apnea in prior  authorization documentation for weight loss medications.  Prediabetes Previous diagnosis with A1c of 5.8. A1c will be checked to assess current status. Weight loss may improve blood pressure and glycemic control. - Ordered A1c test. - Will discuss results and potential treatment options based on A1c results.  Hypertension Diet-controlled with current elevated blood pressure. Weight loss may improve control. - Repeated blood pressure measurement before leaving revealed persistently elevated blood pressure - Will reassess at next visit and if blood pressure is still elevated consider initiating antihypertensive. - Advised to cut down on salt intake and processed foods.        Meds ordered this encounter  Medications   semaglutide -weight management (WEGOVY ) 0.25 MG/0.5ML SOAJ SQ injection    Sig: Inject 0.25 mg into the skin once a week. For 4  weeks then increase to 0.5mg     Dispense:  2 mL    Refill:  0    Dx: PreDM, OSA, HTN   semaglutide -weight management (WEGOVY ) 0.5 MG/0.5ML SOAJ SQ injection    Sig: Inject 0.5 mg into the skin once a week.    Dispense:  2 mL    Refill:  1    Dx: PreDM, OSA, HTN    Follow-up: Return in about 6 weeks (around 09/27/2024) for Blood Pressure follow-up with PCP.       Corrina Sabin, MD, FAAFP. [facility] [place], [place] [phone]   08/16/2024, 1:00 PM      [1]  Allergies Allergen Reactions   Atorvastatin  Nausea Only   Penicillin G Rash   Penicillins Hives and Rash   "

## 2024-08-17 ENCOUNTER — Ambulatory Visit: Payer: Self-pay | Admitting: Family Medicine

## 2024-08-17 LAB — CMP14+EGFR
ALT: 18 [IU]/L (ref 0–32)
AST: 16 [IU]/L (ref 0–40)
Albumin: 3.7 g/dL — ABNORMAL LOW (ref 3.8–4.9)
Alkaline Phosphatase: 108 [IU]/L (ref 49–135)
BUN/Creatinine Ratio: 14 (ref 9–23)
BUN: 11 mg/dL (ref 6–24)
Bilirubin Total: 0.2 mg/dL (ref 0.0–1.2)
CO2: 25 mmol/L (ref 20–29)
Calcium: 9.2 mg/dL (ref 8.7–10.2)
Chloride: 105 mmol/L (ref 96–106)
Creatinine, Ser: 0.79 mg/dL (ref 0.57–1.00)
Globulin, Total: 2.5 g/dL (ref 1.5–4.5)
Glucose: 95 mg/dL (ref 70–99)
Potassium: 4.4 mmol/L (ref 3.5–5.2)
Sodium: 143 mmol/L (ref 134–144)
Total Protein: 6.2 g/dL (ref 6.0–8.5)
eGFR: 89 mL/min/{1.73_m2}

## 2024-08-17 LAB — HEMOGLOBIN A1C
Est. average glucose Bld gHb Est-mCnc: 120 mg/dL
Hgb A1c MFr Bld: 5.8 % — ABNORMAL HIGH (ref 4.8–5.6)

## 2024-08-17 LAB — CBC WITH DIFFERENTIAL/PLATELET
Basophils Absolute: 0.1 10*3/uL (ref 0.0–0.2)
Basos: 1 %
EOS (ABSOLUTE): 0.5 10*3/uL — ABNORMAL HIGH (ref 0.0–0.4)
Eos: 5 %
Hematocrit: 40.1 % (ref 34.0–46.6)
Hemoglobin: 12.8 g/dL (ref 11.1–15.9)
Immature Grans (Abs): 0.1 10*3/uL (ref 0.0–0.1)
Immature Granulocytes: 1 %
Lymphocytes Absolute: 3.4 10*3/uL — ABNORMAL HIGH (ref 0.7–3.1)
Lymphs: 30 %
MCH: 25.3 pg — ABNORMAL LOW (ref 26.6–33.0)
MCHC: 31.9 g/dL (ref 31.5–35.7)
MCV: 79 fL (ref 79–97)
Monocytes Absolute: 0.6 10*3/uL (ref 0.1–0.9)
Monocytes: 5 %
Neutrophils Absolute: 6.7 10*3/uL (ref 1.4–7.0)
Neutrophils: 58 %
Platelets: 371 10*3/uL (ref 150–450)
RBC: 5.05 x10E6/uL (ref 3.77–5.28)
RDW: 15 % (ref 11.7–15.4)
WBC: 11.3 10*3/uL — ABNORMAL HIGH (ref 3.4–10.8)

## 2024-08-17 LAB — LP+NON-HDL CHOLESTEROL
Cholesterol, Total: 176 mg/dL (ref 100–199)
HDL: 37 mg/dL — ABNORMAL LOW
LDL Chol Calc (NIH): 124 mg/dL — ABNORMAL HIGH (ref 0–99)
Total Non-HDL-Chol (LDL+VLDL): 139 mg/dL — ABNORMAL HIGH (ref 0–129)
Triglycerides: 77 mg/dL (ref 0–149)
VLDL Cholesterol Cal: 15 mg/dL (ref 5–40)

## 2024-10-04 ENCOUNTER — Ambulatory Visit: Payer: Self-pay | Admitting: Family Medicine
